# Patient Record
Sex: Female | Born: 1950 | Race: Black or African American | Hispanic: No | Smoking: Never smoker
Health system: Southern US, Community
[De-identification: ages and names within clinical notes are randomized; demographics above are authoritative.]

## PROBLEM LIST (undated history)

## (undated) DIAGNOSIS — R42 Dizziness and giddiness: Secondary | ICD-10-CM

## (undated) DIAGNOSIS — K589 Irritable bowel syndrome without diarrhea: Secondary | ICD-10-CM

## (undated) DIAGNOSIS — E079 Disorder of thyroid, unspecified: Secondary | ICD-10-CM

## (undated) DIAGNOSIS — M5136 Other intervertebral disc degeneration, lumbar region: Secondary | ICD-10-CM

## (undated) DIAGNOSIS — C801 Malignant (primary) neoplasm, unspecified: Secondary | ICD-10-CM

## (undated) DIAGNOSIS — F419 Anxiety disorder, unspecified: Secondary | ICD-10-CM

## (undated) HISTORY — PX: BREAST LUMPECTOMY: SHX2

## (undated) HISTORY — PX: ABDOMINAL HYSTERECTOMY: SHX81

---

## 2016-11-12 ENCOUNTER — Emergency Department (HOSPITAL_BASED_OUTPATIENT_CLINIC_OR_DEPARTMENT_OTHER)
Admission: EM | Admit: 2016-11-12 | Discharge: 2016-11-13 | Disposition: A | Discharge status: Home / Self Care | Payer: Medicare HMO | Attending: Emergency Medicine | Admitting: Emergency Medicine

## 2016-11-12 ENCOUNTER — Encounter (HOSPITAL_BASED_OUTPATIENT_CLINIC_OR_DEPARTMENT_OTHER): Payer: Self-pay | Admitting: Emergency Medicine

## 2016-11-12 DIAGNOSIS — R197 Diarrhea, unspecified: Secondary | ICD-10-CM

## 2016-11-12 DIAGNOSIS — R42 Dizziness and giddiness: Secondary | ICD-10-CM | POA: Diagnosis present

## 2016-11-12 DIAGNOSIS — Z79899 Other long term (current) drug therapy: Secondary | ICD-10-CM | POA: Insufficient documentation

## 2016-11-12 DIAGNOSIS — Z7982 Long term (current) use of aspirin: Secondary | ICD-10-CM | POA: Diagnosis not present

## 2016-11-12 HISTORY — DX: Disorder of thyroid, unspecified: E07.9

## 2016-11-12 HISTORY — DX: Irritable bowel syndrome without diarrhea: K58.9

## 2016-11-12 HISTORY — DX: Malignant (primary) neoplasm, unspecified: C80.1

## 2016-11-12 NOTE — ED Triage Notes (Signed)
PT presents to ED with complaints of dizziness intermittent for the past year but worse today. PT had been taking meds without relief.

## 2016-11-13 DIAGNOSIS — R42 Dizziness and giddiness: Secondary | ICD-10-CM | POA: Diagnosis not present

## 2016-11-13 MED ORDER — DIAZEPAM 2 MG PO TABS: 2.0000 mg | ORAL_TABLET | Freq: Three times a day (TID) | ORAL | 0 refills | Status: DC | PRN

## 2016-11-13 MED ORDER — DIAZEPAM 5 MG/ML IJ SOLN
2.5000 mg | Freq: Once | INTRAMUSCULAR | Status: AC
  Administered 2016-11-13: 2.5 mg via INTRAVENOUS
  Filled 2016-11-13: qty 2

## 2016-11-13 NOTE — ED Provider Notes (Addendum)
[facility] DEPT [facility] Provider Note: Georgena Spurling, MD, FACEP  CSN: [id number] MRN: [id number] ARRIVAL: 11/12/16 at [place]: [facility]  Shannon Farrell is a 66 y.o. female with a long-standing history of vertigo. She describes the vertigo as the room spinning. It tends to happen sporadically but is somewhat changed with movements of the head. She had her meclizine dose increased to 12.5 milligrams 3 times a day about a month ago. Despite this she continues to have episodes of vertigo and yesterday her vertigo became "really bad". There is no associated nausea or vomiting. She is having some milder vertigo at the present time. She had been doing home exercises for realignment of crystals in her inner ears but has not been doing these exercises for several months.  She also states that for the past several weeks her stools have been loose, which she describes as leg normal diarrhea. She denies abdominal pain or cramping. She denies blood or mucus in the stools. She has not taken anything for the diarrhea.   Past Medical History:  Diagnosis Date  . Cancer ([place])   . Irritable bowel syndrome   . Thyroid disease     Past Surgical History:  Procedure Laterality Date  . BREAST LUMPECTOMY      No family history on file.  Social History  Substance Use Topics  . Smoking status: Not on file  . Smokeless tobacco: Not on file  . Alcohol use Not on file    Prior to Admission medications   Medication Sig Start Date End Date Taking? Authorizing Provider  aspirin EC 81 MG tablet Take 81 mg by mouth daily.   Yes Historical Provider, MD  calcium acetate (PHOSLO) 667 MG capsule Take by mouth 3 (three) times daily with meals.   Yes Historical Provider, MD  cetirizine (ZYRTEC) 10 MG tablet Take 10 mg by mouth daily.   Yes Historical Provider, MD  cyclobenzaprine (FLEXERIL) 10 MG tablet Take by mouth 3 (three) times daily as needed for  muscle spasms.   Yes Historical Provider, MD  diclofenac (VOLTAREN) 50 MG EC tablet Take 50 mg by mouth 2 (two) times daily.   Yes Historical Provider, MD  dicyclomine (BENTYL) 10 MG capsule Take 10 mg by mouth 4 (four) times daily -  before meals and at bedtime.   Yes Historical Provider, MD  folic acid (FOLVITE) 1 MG tablet Take 1 mg by mouth daily.   Yes Historical Provider, MD  levothyroxine (SYNTHROID, LEVOTHROID) 75 MCG tablet Take 75 mcg by mouth daily before breakfast.   Yes Historical Provider, MD  meclizine (ANTIVERT) 12.5 MG tablet Take 12.5 mg by mouth 3 (three) times daily as needed for dizziness.   Yes Historical Provider, MD  Multiple Vitamin (MULTIVITAMIN) capsule Take 1 capsule by mouth daily.   Yes Historical Provider, MD  omeprazole (PRILOSEC) 40 MG capsule Take 40 mg by mouth daily.   Yes Historical Provider, MD    Allergies Codeine; Tramadol; and Vicodin [hydrocodone-acetaminophen]   REVIEW OF SYSTEMS  Negative except as noted here or in the History of Present Illness.   PHYSICAL EXAMINATION  Initial Vital Signs Blood pressure 115/74, pulse 76, temperature 98.4 F (36.9 C), temperature source Oral, resp. rate 12, height 5' 4.5" (1.638 m), weight 151 lb (68.5 kg), SpO2 97 %.  Examination General: Well-developed, well-nourished female in no acute distress; appearance consistent with age of record HENT: normocephalic; atraumatic Eyes: pupils  equal, round and reactive to light; extraocular muscles intact; no nystagmus Neck: supple Heart: regular rate and rhythm Lungs: clear to auscultation bilaterally Abdomen: soft; nondistended; nontender; no masses or hepatosplenomegaly; bowel sounds present Extremities: No deformity; full range of motion; pulses normal Neurologic: Awake, alert and oriented; motor function intact in all extremities and symmetric; no facial droop Skin: Warm and dry Psychiatric: Normal mood and affect   RESULTS  Summary of this visit's results,  reviewed by myself:   EKG Interpretation  Date/Time:    Ventricular Rate:    PR Interval:    QRS Duration:   QT Interval:    QTC Calculation:   R Axis:     Text Interpretation:        Laboratory Studies: No results found for this or any previous visit (from the past 24 hour(s)). Imaging Studies: No results found.  ED COURSE  Nursing notes and initial vitals signs, including pulse oximetry, reviewed.  Vitals:   11/13/16 0034 11/13/16 0045 11/13/16 0100 11/13/16 0200  BP: 108/75  115/74 120/75  Pulse: 77 78 76 75  Resp: 18 15 12  (!) 23  Temp:      TempSrc:      SpO2: 95% 97% 97% 97%  Weight:      Height:       2:48 AM Vertigo improved after 2.5 milligrams of Valium IV. Patient was advised to restart her vertigo therapy exercises. She was advised to continue taking meclizine but we will provide a prescription for Valium to take in its place for more severe episodes. She was advised that Valium can cause drowsiness.  PROCEDURES    ED DIAGNOSES     ICD-9-CM ICD-10-CM   1. Vertigo 780.4 R42   2. Diarrhea in adult patient 787.91 R19.7        Shanon Rosser, MD 11/13/16 [place], MD 11/13/16 [phone]

## 2016-11-13 NOTE — ED Notes (Signed)
ED Provider at bedside. 

## 2016-11-13 NOTE — ED Notes (Signed)
Pt given d/c instructions as per chart. Rx x 1 with precautions. Verbalizes understanding. No questions. 

## 2017-02-18 ENCOUNTER — Encounter (HOSPITAL_BASED_OUTPATIENT_CLINIC_OR_DEPARTMENT_OTHER): Payer: Self-pay | Admitting: Emergency Medicine

## 2017-02-18 ENCOUNTER — Emergency Department (HOSPITAL_BASED_OUTPATIENT_CLINIC_OR_DEPARTMENT_OTHER)
Admission: EM | Admit: 2017-02-18 | Discharge: 2017-02-19 | Disposition: A | Discharge status: Home / Self Care | Payer: Medicare HMO | Attending: Physician Assistant | Admitting: Physician Assistant

## 2017-02-18 DIAGNOSIS — M62838 Other muscle spasm: Secondary | ICD-10-CM | POA: Insufficient documentation

## 2017-02-18 DIAGNOSIS — Z79899 Other long term (current) drug therapy: Secondary | ICD-10-CM | POA: Insufficient documentation

## 2017-02-18 DIAGNOSIS — Z859 Personal history of malignant neoplasm, unspecified: Secondary | ICD-10-CM | POA: Diagnosis not present

## 2017-02-18 DIAGNOSIS — Z7982 Long term (current) use of aspirin: Secondary | ICD-10-CM | POA: Insufficient documentation

## 2017-02-18 DIAGNOSIS — M542 Cervicalgia: Secondary | ICD-10-CM | POA: Diagnosis present

## 2017-02-18 MED ORDER — CYCLOBENZAPRINE HCL 10 MG PO TABS: 10.0000 mg | ORAL_TABLET | Freq: Two times a day (BID) | ORAL | 0 refills | Status: DC | PRN

## 2017-02-18 MED ORDER — IBUPROFEN 800 MG PO TABS: 800.0000 mg | ORAL_TABLET | Freq: Three times a day (TID) | ORAL | 0 refills | Status: DC

## 2017-02-18 MED ORDER — IBUPROFEN 800 MG PO TABS
800.0000 mg | ORAL_TABLET | Freq: Once | ORAL | Status: AC
  Administered 2017-02-18: 800 mg via ORAL
  Filled 2017-02-18: qty 1

## 2017-02-18 NOTE — ED Provider Notes (Signed)
[facility] DEPT [facility] Provider Note   CSN: [id number] Arrival date & time: 02/18/17  2245     History   Chief Complaint Chief Complaint  Patient presents with  . Neck Pain    HPI Shannon Farrell is a 66 y.o. female.  HPI Patient is here with pain in the left side of her head and left trapezius. Patient reports it feels tight. She took a Flexeril yesterday and immediately completely better. However she didn't have any functional today. She took some Tylenol which made a little bit better.   Patient was worried that she was having a stroke. She's had no neurologic symptoms.  Past Medical History:  Diagnosis Date  . Cancer ([place])   . Irritable bowel syndrome   . Thyroid disease     There are no active problems to display for this patient.   Past Surgical History:  Procedure Laterality Date  . BREAST LUMPECTOMY      OB History    No data available       Home Medications    Prior to Admission medications   Medication Sig Start Date End Date Taking? Authorizing Provider  aspirin EC 81 MG tablet Take 81 mg by mouth daily.    [provider]  calcium acetate (PHOSLO) 667 MG capsule Take by mouth 3 (three) times daily with meals.    [provider]  cetirizine (ZYRTEC) 10 MG tablet Take 10 mg by mouth daily.    [provider]  cyclobenzaprine (FLEXERIL) 10 MG tablet Take by mouth 3 (three) times daily as needed for muscle spasms.    [provider]  cyclobenzaprine (FLEXERIL) 10 MG tablet Take 1 tablet (10 mg total) by mouth 2 (two) times daily as needed for muscle spasms. 02/18/17   Koni Kannan Lyn, MD  diazepam (VALIUM) 2 MG tablet Take 1 tablet (2 mg total) by mouth every 8 (eight) hours as needed (for vertigo; may cause drowsiness). 11/13/16   Molpus, John, MD  diclofenac (VOLTAREN) 50 MG EC tablet Take 50 mg by mouth 2 (two) times daily.    [provider]  dicyclomine (BENTYL) 10 MG capsule Take 10 mg by mouth 4  (four) times daily -  before meals and at bedtime.    [provider]  folic acid (FOLVITE) 1 MG tablet Take 1 mg by mouth daily.    [provider]  ibuprofen (ADVIL,MOTRIN) 800 MG tablet Take 1 tablet (800 mg total) by mouth 3 (three) times daily. 02/18/17   Lorance Pickeral Lyn, MD  levothyroxine (SYNTHROID, LEVOTHROID) 75 MCG tablet Take 75 mcg by mouth daily before breakfast.    [provider]  meclizine (ANTIVERT) 12.5 MG tablet Take 12.5 mg by mouth 3 (three) times daily as needed for dizziness.    [provider]  Multiple Vitamin (MULTIVITAMIN) capsule Take 1 capsule by mouth daily.    [provider]  omeprazole (PRILOSEC) 40 MG capsule Take 40 mg by mouth daily.    [provider]    Family History No family history on file.  Social History Social History  Substance Use Topics  . Smoking status: Not on file  . Smokeless tobacco: Not on file  . Alcohol use Not on file     Allergies   Codeine; Tramadol; and Vicodin [hydrocodone-acetaminophen]   Review of Systems Review of Systems  Constitutional: Negative for activity change.  Respiratory: Negative for shortness of breath.   Cardiovascular: Negative for chest pain.  Gastrointestinal: Negative  for abdominal pain.     Physical Exam Updated Vital Signs BP 134/82 (BP Location: Left Arm)   Pulse 83   Temp 98.7 F (37.1 C) (Oral)   Resp 19   SpO2 98%   Physical Exam  Constitutional: She is oriented to person, place, and time. She appears well-developed and well-nourished.  HENT:  Head: Normocephalic and atraumatic.  Eyes: Right eye exhibits no discharge.  Neck:  No menigismus. Tenderness to L trapezius.   Cardiovascular: Normal rate, regular rhythm and normal heart sounds.   No murmur heard. Pulmonary/Chest: Effort normal and breath sounds normal. She has no wheezes. She has no rales.  Neurological: She is oriented to person, place, and time. No cranial  nerve deficit.  Equal strength and sensation bialteral UE. AMbualtion without difficulty.   Skin: Skin is warm and dry. She is not diaphoretic.  Psychiatric: She has a normal mood and affect.  Nursing note and vitals reviewed.    ED Treatments / Results  Labs (all labs ordered are listed, but only abnormal results are displayed) Labs Reviewed - No data to display  EKG  EKG Interpretation None       Radiology No results found.  Procedures Procedures (including critical care time)  Medications Ordered in ED Medications  ibuprofen (ADVIL,MOTRIN) tablet 800 mg (not administered)     Initial Impression / Assessment and Plan / ED Course  I have reviewed the triage vital signs and the nursing notes.  Pertinent labs & imaging results that were available during my care of the patient were reviewed by me and considered in my medical decision making (see chart for details).    Patient's here with muscle tightness in her left trapezius. No neurologic deficits. Did not suspect any  pathology. Patient was better Flexeril. We'll treat with flexeril again.   Final Clinical Impressions(s) / ED Diagnoses   Final diagnoses:  Trapezius muscle spasm    New Prescriptions New Prescriptions   CYCLOBENZAPRINE (FLEXERIL) 10 MG TABLET    Take 1 tablet (10 mg total) by mouth 2 (two) times daily as needed for muscle spasms.   IBUPROFEN (ADVIL,MOTRIN) 800 MG TABLET    Take 1 tablet (800 mg total) by mouth 3 (three) times daily.     Macarthur Critchley, MD 02/18/17 [phone]

## 2017-02-18 NOTE — ED Triage Notes (Signed)
PT presents with c/o neck pain since yesterday. No none injury

## 2017-02-18 NOTE — Discharge Instructions (Signed)
We think this is a muscle spasm. Please return with any change in symptoms.

## 2017-07-28 ENCOUNTER — Encounter (HOSPITAL_BASED_OUTPATIENT_CLINIC_OR_DEPARTMENT_OTHER): Payer: Self-pay | Admitting: Emergency Medicine

## 2017-07-28 ENCOUNTER — Other Ambulatory Visit: Payer: Self-pay

## 2017-07-28 ENCOUNTER — Emergency Department (HOSPITAL_BASED_OUTPATIENT_CLINIC_OR_DEPARTMENT_OTHER)
Admission: EM | Admit: 2017-07-28 | Discharge: 2017-07-28 | Disposition: A | Discharge status: Home / Self Care | Payer: Medicare HMO | Attending: Emergency Medicine | Admitting: Emergency Medicine

## 2017-07-28 DIAGNOSIS — S0501XA Injury of conjunctiva and corneal abrasion without foreign body, right eye, initial encounter: Secondary | ICD-10-CM

## 2017-07-28 DIAGNOSIS — X58XXXA Exposure to other specified factors, initial encounter: Secondary | ICD-10-CM | POA: Diagnosis not present

## 2017-07-28 DIAGNOSIS — Z79899 Other long term (current) drug therapy: Secondary | ICD-10-CM | POA: Insufficient documentation

## 2017-07-28 DIAGNOSIS — Z7982 Long term (current) use of aspirin: Secondary | ICD-10-CM | POA: Insufficient documentation

## 2017-07-28 DIAGNOSIS — E079 Disorder of thyroid, unspecified: Secondary | ICD-10-CM | POA: Insufficient documentation

## 2017-07-28 DIAGNOSIS — Y93E8 Activity, other personal hygiene: Secondary | ICD-10-CM | POA: Diagnosis not present

## 2017-07-28 DIAGNOSIS — Y92019 Unspecified place in single-family (private) house as the place of occurrence of the external cause: Secondary | ICD-10-CM | POA: Insufficient documentation

## 2017-07-28 DIAGNOSIS — Y998 Other external cause status: Secondary | ICD-10-CM | POA: Insufficient documentation

## 2017-07-28 DIAGNOSIS — Z853 Personal history of malignant neoplasm of breast: Secondary | ICD-10-CM | POA: Insufficient documentation

## 2017-07-28 DIAGNOSIS — S0591XA Unspecified injury of right eye and orbit, initial encounter: Secondary | ICD-10-CM | POA: Diagnosis present

## 2017-07-28 DIAGNOSIS — H53141 Visual discomfort, right eye: Secondary | ICD-10-CM | POA: Diagnosis not present

## 2017-07-28 MED ORDER — FLUORESCEIN SODIUM 1 MG OP STRP
1.0000 | ORAL_STRIP | Freq: Once | OPHTHALMIC | Status: AC
  Administered 2017-07-28: 1 via OPHTHALMIC
  Filled 2017-07-28: qty 1

## 2017-07-28 MED ORDER — TETRACAINE HCL 0.5 % OP SOLN
2.0000 [drp] | Freq: Once | OPHTHALMIC | Status: AC
  Administered 2017-07-28: 2 [drp] via OPHTHALMIC
  Filled 2017-07-28: qty 4

## 2017-07-28 MED ORDER — ERYTHROMYCIN 5 MG/GM OP OINT
TOPICAL_OINTMENT | Freq: Once | OPHTHALMIC | Status: AC
  Administered 2017-07-28: 1 via OPHTHALMIC
  Filled 2017-07-28: qty 3.5

## 2017-07-28 NOTE — ED Triage Notes (Signed)
Patient states that she was trying to put her eye liner on her right eye and she may have pocked her right eye with the eyeline pencil or she has a piece in her eye. The patient tried to wash her eye out and it has not helped

## 2017-07-28 NOTE — Discharge Instructions (Signed)
Medications: Erythromycin ointment  Treatment: Apply half-inch ribbon to your right lower eyelid 4 times daily for 5 days.  You can take ibuprofen as prescribed as well as Tylenol alternating for pain.  Follow-up: Please follow-up with the ophthalmologist, Dr. Katy Fitch, by calling his office first thing on Monday morning for recheck and further evaluation.  Please return to the emergency department in the meantime if you develop any new or worsening symptoms.

## 2017-07-28 NOTE — ED Notes (Signed)
ED Provider at bedside. 

## 2017-07-28 NOTE — ED Notes (Signed)
Pt verbalizes understanding of d/c instructions and denies any further needs at this time. 

## 2017-07-29 NOTE — ED Provider Notes (Signed)
[facility] EMERGENCY DEPARTMENT Provider Note   CSN: [id number] Arrival date & time: 07/28/17  1947     History   Chief Complaint Chief Complaint  Patient presents with  . Eye Problem    HPI Shannon Farrell is a 67 y.o. female with history of breast cancer, irritable bowel syndrome, thyroid disease who presents with right eye pain.  Patient reports she was putting on eye pencil on and feels a piece broke off in her eye.  Since then, she has been having significant irritation in the right eye.  She has had associated photophobia.  She denies any vision changes other than reporting it hurts to keep her eye open.  It hurts her eye to blink. She washed her eye out at home with an over-the-counter eye wash and the sink at home.  She reports she still feels like there is a foreign body in her eye.  HPI  Past Medical History:  Diagnosis Date  . Cancer ([facility])    breast  . Irritable bowel syndrome   . Thyroid disease     There are no active problems to display for this patient.   Past Surgical History:  Procedure Laterality Date  . BREAST LUMPECTOMY      OB History    No data available       Home Medications    Prior to Admission medications   Medication Sig Start Date End Date Taking? Authorizing Provider  aspirin EC 81 MG tablet Take 81 mg by mouth daily.    [provider]  calcium acetate (PHOSLO) 667 MG capsule Take by mouth 3 (three) times daily with meals.    [provider]  cetirizine (ZYRTEC) 10 MG tablet Take 10 mg by mouth daily.    [provider]  cyclobenzaprine (FLEXERIL) 10 MG tablet Take by mouth 3 (three) times daily as needed for muscle spasms.    [provider]  cyclobenzaprine (FLEXERIL) 10 MG tablet Take 1 tablet (10 mg total) by mouth 2 (two) times daily as needed for muscle spasms. 02/18/17   Mackuen, Courteney Lyn, MD  diazepam (VALIUM) 2 MG tablet Take 1 tablet (2 mg total) by mouth every 8 (eight) hours  as needed (for vertigo; may cause drowsiness). 11/13/16   Molpus, John, MD  diclofenac (VOLTAREN) 50 MG EC tablet Take 50 mg by mouth 2 (two) times daily.    [provider]  dicyclomine (BENTYL) 10 MG capsule Take 10 mg by mouth 4 (four) times daily -  before meals and at bedtime.    [provider]  folic acid (FOLVITE) 1 MG tablet Take 1 mg by mouth daily.    [provider]  ibuprofen (ADVIL,MOTRIN) 800 MG tablet Take 1 tablet (800 mg total) by mouth 3 (three) times daily. 02/18/17   Mackuen, Courteney Lyn, MD  levothyroxine (SYNTHROID, LEVOTHROID) 75 MCG tablet Take 75 mcg by mouth daily before breakfast.    [provider]  meclizine (ANTIVERT) 12.5 MG tablet Take 12.5 mg by mouth 3 (three) times daily as needed for dizziness.    [provider]  Multiple Vitamin (MULTIVITAMIN) capsule Take 1 capsule by mouth daily.    [provider]  omeprazole (PRILOSEC) 40 MG capsule Take 40 mg by mouth daily.    [provider]    Family History History reviewed. No pertinent family history.  Social History Social History   Tobacco Use  . Smoking status: Never Smoker  . Smokeless tobacco: Never  Used  Substance Use Topics  . Alcohol use: Not on file  . Drug use: Not on file     Allergies   Codeine; Tramadol; and Vicodin [hydrocodone-acetaminophen]   Review of Systems Review of Systems  Constitutional: Negative for fever.  Eyes: Positive for photophobia, pain, discharge (watering) and redness. Negative for itching and visual disturbance.     Physical Exam Updated Vital Signs BP 122/76 (BP Location: Left Arm)   Pulse 72   Temp 98.7 F (37.1 C) (Oral)   Resp 18   Ht 5' 4.5" (1.638 m)   Wt 65.8 kg (145 lb)   SpO2 100%   BMI 24.50 kg/m   Physical Exam  Constitutional: She appears well-developed and well-nourished. No distress.  HENT:  Head: Normocephalic and atraumatic.  Mouth/Throat: Oropharynx is clear and moist.  No oropharyngeal exudate.  Eyes: EOM and lids are normal. Pupils are equal, round, and reactive to light. Lids are everted and swept, no foreign bodies found. Right eye exhibits discharge (watery). No foreign body present in the right eye. Left eye exhibits no discharge. Right conjunctiva is injected. Left conjunctiva is not injected. No scleral icterus.    No consensual photophobia No tenderness to the lid or surrounding the eye  Neck: Normal range of motion.  Cardiovascular: Normal rate, regular rhythm, normal heart sounds and intact distal pulses. Exam reveals no gallop and no friction rub.  No murmur heard. Pulmonary/Chest: Effort normal and breath sounds normal. No stridor. No respiratory distress. She has no wheezes. She has no rales.  Musculoskeletal: She exhibits no edema.  Neurological: She is alert. Coordination normal.  Skin: Skin is warm and dry. No rash noted. She is not diaphoretic. No pallor.  Psychiatric: She has a normal mood and affect.  Nursing note and vitals reviewed.    ED Treatments / Results  Labs (all labs ordered are listed, but only abnormal results are displayed) Labs Reviewed - No data to display  EKG  EKG Interpretation None       Radiology No results found.  Procedures Procedures (including critical care time)  Medications Ordered in ED Medications  tetracaine (PONTOCAINE) 0.5 % ophthalmic solution 2 drop (2 drops Right Eye Given by Other 07/28/17 2325)  fluorescein ophthalmic strip 1 strip (1 strip Right Eye Given by Other 07/28/17 2325)  erythromycin ophthalmic ointment (1 application Right Eye Given 07/28/17 2325)     Initial Impression / Assessment and Plan / ED Course  I have reviewed the triage vital signs and the nursing notes.  Pertinent labs & imaging results that were available during my care of the patient were reviewed by me and considered in my medical decision making (see chart for details).     Pt with corneal abrasion on  exam. No evidence of FB.  Exam not concerning for orbital cellulitis, hyphema. No concern for uveitis. Patient will be discharged home with erythromycin ointment.   Patient understands to follow up with ophthalmology in 2-3 days; return precautions discussed. Patient appears safe for discharge. Patient also evaluated by Dr. Leonette Monarch who agrees with plan.  Patient vitals stable throughout ED course and discharged in satisfactory condition   Final Clinical Impressions(s) / ED Diagnoses   Final diagnoses:  Abrasion of right cornea, initial encounter    ED Discharge Orders    None       Frederica Kuster, PA-C 07/29/17 1146    Fatima Blank, MD 07/30/17 [phone]

## 2018-01-01 ENCOUNTER — Emergency Department (HOSPITAL_BASED_OUTPATIENT_CLINIC_OR_DEPARTMENT_OTHER)
Admission: EM | Admit: 2018-01-01 | Discharge: 2018-01-01 | Disposition: A | Discharge status: Home / Self Care | Payer: Medicare HMO | Attending: Emergency Medicine | Admitting: Emergency Medicine

## 2018-01-01 ENCOUNTER — Encounter (HOSPITAL_BASED_OUTPATIENT_CLINIC_OR_DEPARTMENT_OTHER): Payer: Self-pay

## 2018-01-01 ENCOUNTER — Other Ambulatory Visit: Payer: Self-pay

## 2018-01-01 ENCOUNTER — Emergency Department (HOSPITAL_BASED_OUTPATIENT_CLINIC_OR_DEPARTMENT_OTHER): Payer: Medicare HMO

## 2018-01-01 DIAGNOSIS — Z79899 Other long term (current) drug therapy: Secondary | ICD-10-CM | POA: Diagnosis not present

## 2018-01-01 DIAGNOSIS — Z7982 Long term (current) use of aspirin: Secondary | ICD-10-CM | POA: Diagnosis not present

## 2018-01-01 DIAGNOSIS — K219 Gastro-esophageal reflux disease without esophagitis: Secondary | ICD-10-CM | POA: Insufficient documentation

## 2018-01-01 DIAGNOSIS — R079 Chest pain, unspecified: Secondary | ICD-10-CM | POA: Diagnosis not present

## 2018-01-01 DIAGNOSIS — R110 Nausea: Secondary | ICD-10-CM | POA: Insufficient documentation

## 2018-01-01 DIAGNOSIS — R059 Cough, unspecified: Secondary | ICD-10-CM

## 2018-01-01 DIAGNOSIS — R05 Cough: Secondary | ICD-10-CM | POA: Insufficient documentation

## 2018-01-01 DIAGNOSIS — R0789 Other chest pain: Secondary | ICD-10-CM

## 2018-01-01 HISTORY — DX: Anxiety disorder, unspecified: F41.9

## 2018-01-01 LAB — BASIC METABOLIC PANEL
Anion gap: 12 (ref 5–15)
BUN: 12 mg/dL (ref 6–20)
CALCIUM: 8.9 mg/dL (ref 8.9–10.3)
CO2: 27 mmol/L (ref 22–32)
Chloride: 100 mmol/L — ABNORMAL LOW (ref 101–111)
Creatinine, Ser: 0.69 mg/dL (ref 0.44–1.00)
GFR calc Af Amer: >60 mL/min (ref >60)
GFR calc non Af Amer: >60 mL/min (ref >60)
GLUCOSE: 87 mg/dL (ref 65–99)
Potassium: 2.8 mmol/L — ABNORMAL LOW (ref 3.5–5.1)
Sodium: 139 mmol/L (ref 135–145)

## 2018-01-01 LAB — CBC
HEMATOCRIT: 35.2 % — AB (ref 36.0–46.0)
Hemoglobin: 12.1 g/dL (ref 12.0–15.0)
MCH: 27.8 pg (ref 26.0–34.0)
MCHC: 34.4 g/dL (ref 30.0–36.0)
MCV: 80.9 fL (ref 78.0–100.0)
Platelets: 252 10*3/uL (ref 150–400)
RBC: 4.35 MIL/uL (ref 3.87–5.11)
RDW: 14.4 % (ref 11.5–15.5)
WBC: 9.1 10*3/uL (ref 4.0–10.5)

## 2018-01-01 LAB — TROPONIN I

## 2018-01-01 MED ORDER — GI COCKTAIL ~~LOC~~
30.0000 mL | Freq: Once | ORAL | Status: AC
  Administered 2018-01-01: 30 mL via ORAL
  Filled 2018-01-01: qty 30

## 2018-01-01 MED ORDER — POTASSIUM CHLORIDE CRYS ER 20 MEQ PO TBCR
40.0000 meq | EXTENDED_RELEASE_TABLET | Freq: Once | ORAL | Status: AC
Start: 2018-01-01 — End: 2018-01-01
  Administered 2018-01-01: 40 meq via ORAL
  Filled 2018-01-01: qty 2

## 2018-01-01 MED ORDER — ALBUTEROL SULFATE HFA 108 (90 BASE) MCG/ACT IN AERS
2.0000 | INHALATION_SPRAY | Freq: Once | RESPIRATORY_TRACT | Status: AC
Start: 2018-01-01 — End: 2018-01-01
  Administered 2018-01-01: 2 via RESPIRATORY_TRACT
  Filled 2018-01-01: qty 6.7

## 2018-01-01 NOTE — ED Notes (Signed)
Attempted IV x 1 without success.  

## 2018-01-01 NOTE — Discharge Instructions (Signed)
Your work-up today did not show evidence of a bacterial infection or pneumonia.  We suspect you may have had a viral infection leading to your coughing.  As you felt better with albuterol, please use it at home and use 2 puffs every 4-6 hours.  You may also use the Maalox since that seemed to help each night for the reflux discomfort refill.  Please follow-up with your primary doctor in several days.  If any symptoms change or worsen, please return to the nearest emergency department

## 2018-01-01 NOTE — ED Notes (Signed)
ED Provider at bedside. 

## 2018-01-01 NOTE — ED Notes (Signed)
Attempted IV in left hand unsuccessful.  

## 2018-01-01 NOTE — ED Provider Notes (Signed)
[facility] Provider Note   CSN: [id number] Arrival date & time: 01/01/18  1952     History   Chief Complaint Chief Complaint  Patient presents with  . Chest Pain    HPI Shannon Farrell is a 67 y.o. female.  The history is provided by the patient and medical records.  Cough  This is a new problem. The current episode started more than 1 week ago. The problem occurs constantly. The problem has not changed since onset.The cough is non-productive. There has been no fever. Associated symptoms include chills. Pertinent negatives include no chest pain, no sweats, no headaches, no rhinorrhea, no shortness of breath and no wheezing. She has tried cough syrup for the symptoms. The treatment provided no relief. Her past medical history does not include pneumonia, COPD, emphysema or asthma.    Past Medical History:  Diagnosis Date  . Anxiety   . Cancer (HCC)    breast  . Irritable bowel syndrome   . Thyroid disease     There are no active problems to display for this patient.   Past Surgical History:  Procedure Laterality Date  . BREAST LUMPECTOMY       OB History   None      Home Medications    Prior to Admission medications   Medication Sig Start Date End Date Taking? Authorizing Provider  aspirin EC 81 MG tablet Take 81 mg by mouth daily.    [provider]  calcium acetate (PHOSLO) 667 MG capsule Take by mouth 3 (three) times daily with meals.    [provider]  cetirizine (ZYRTEC) 10 MG tablet Take 10 mg by mouth daily.    [provider]  cyclobenzaprine (FLEXERIL) 10 MG tablet Take by mouth 3 (three) times daily as needed for muscle spasms.    [provider]  cyclobenzaprine (FLEXERIL) 10 MG tablet Take 1 tablet (10 mg total) by mouth 2 (two) times daily as needed for muscle spasms. 02/18/17   Mackuen, Courteney Lyn, MD  diazepam (VALIUM) 2 MG tablet Take 1 tablet (2 mg total) by mouth every 8  (eight) hours as needed (for vertigo; may cause drowsiness). 11/13/16   Molpus, John, MD  diclofenac (VOLTAREN) 50 MG EC tablet Take 50 mg by mouth 2 (two) times daily.    [provider]  dicyclomine (BENTYL) 10 MG capsule Take 10 mg by mouth 4 (four) times daily -  before meals and at bedtime.    [provider]  folic acid (FOLVITE) 1 MG tablet Take 1 mg by mouth daily.    [provider]  ibuprofen (ADVIL,MOTRIN) 800 MG tablet Take 1 tablet (800 mg total) by mouth 3 (three) times daily. 02/18/17   Mackuen, Courteney Lyn, MD  levothyroxine (SYNTHROID, LEVOTHROID) 75 MCG tablet Take 75 mcg by mouth daily before breakfast.    [provider]  meclizine (ANTIVERT) 12.5 MG tablet Take 12.5 mg by mouth 3 (three) times daily as needed for dizziness.    [provider]  Multiple Vitamin (MULTIVITAMIN) capsule Take 1 capsule by mouth daily.    [provider]  omeprazole (PRILOSEC) 40 MG capsule Take 40 mg by mouth daily.    [provider]    Family History No family history on file.  Social History Social History   Tobacco Use  . Smoking status: Never Smoker  . Smokeless tobacco: Never Used  Substance Use Topics  . Alcohol use: Never    Frequency:  Never  . Drug use: Never     Allergies   Codeine; Tramadol; and Vicodin [hydrocodone-acetaminophen]   Review of Systems Review of Systems  Constitutional: Positive for chills. Negative for diaphoresis, fatigue and fever.  HENT: Negative for congestion and rhinorrhea.   Respiratory: Positive for cough and chest tightness. Negative for shortness of breath, wheezing and stridor.   Cardiovascular: Negative for chest pain, palpitations and leg swelling.  Gastrointestinal: Positive for nausea. Negative for constipation, diarrhea and vomiting.  Genitourinary: Negative for flank pain.  Musculoskeletal: Negative for back pain, neck pain and neck stiffness.  Neurological: Negative for  weakness, light-headedness, numbness and headaches.  Psychiatric/Behavioral: Negative for agitation.  All other systems reviewed and are negative.    Physical Exam Updated Vital Signs BP 127/72 (BP Location: Left Arm)   Pulse 84   Temp 98.3 F (36.8 C) (Oral)   Resp 20   Wt 65.2 kg (143 lb 12.8 oz)   SpO2 100%   BMI 24.30 kg/m   Physical Exam  Constitutional: She appears well-developed and well-nourished. No distress.  HENT:  Head: Normocephalic and atraumatic.  Nose: Nose normal.  Mouth/Throat: Oropharynx is clear and moist. No oropharyngeal exudate.  Eyes: Pupils are equal, round, and reactive to light. Conjunctivae and EOM are normal.  Neck: Normal range of motion. Neck supple.  Cardiovascular: Normal rate, regular rhythm and intact distal pulses.  No murmur heard. Pulmonary/Chest: Effort normal and breath sounds normal. No respiratory distress. She has no wheezes. She has no rales. She exhibits no tenderness.  Abdominal: Soft. She exhibits no distension. There is no tenderness.  Musculoskeletal: She exhibits no edema or tenderness.  Lymphadenopathy:    She has no cervical adenopathy.  Neurological: She is alert. No sensory deficit. She exhibits normal muscle tone.  Skin: Skin is warm and dry. Capillary refill takes less than 2 seconds. No rash noted. She is not diaphoretic. No erythema.  Psychiatric: She has a normal mood and affect.  Nursing note and vitals reviewed.    ED Treatments / Results  Labs (all labs ordered are listed, but only abnormal results are displayed) Labs Reviewed  BASIC METABOLIC PANEL - Abnormal; Notable for the following components:      Result Value   Potassium 2.8 (*)    Chloride 100 (*)    All other components within normal limits  CBC - Abnormal; Notable for the following components:   HCT 35.2 (*)    All other components within normal limits  TROPONIN I    EKG EKG Interpretation  Date/Time:  Monday January 01 2018 19:58:02  EDT Ventricular Rate:  79 PR Interval:  162 QRS Duration: 82 QT Interval:  396 QTC Calculation: 454 R Axis:   43 Text Interpretation:  Normal sinus rhythm with sinus arrhythmia Nonspecific ST and T wave abnormality Abnormal ECG When compared to prior, no significant changes.  No STEMI Confirmed by Antony Blackbird [phone]) on 01/01/2018 11:27:11 PM   Radiology Dg Chest 2 View  Result Date: 01/01/2018 CLINICAL DATA:  Chest pain, cough for 1 week. EXAM: CHEST - 2 VIEW COMPARISON:  None. FINDINGS: Cardiomediastinal silhouette is normal. No pleural effusions or focal consolidations. Mild bronchitic changes with bandlike density LEFT lung base. Trachea projects midline and there is no pneumothorax. Soft tissue planes and included osseous structures are non-suspicious. Mild scoliosis. IMPRESSION: Bronchitic changes with LEFT lung base atelectasis/scarring. Electronically Signed   By: Elon Alas M.D.   On: 01/01/2018 20:32    Procedures Procedures (including  critical care time)  Medications Ordered in ED Medications  albuterol (PROVENTIL HFA;VENTOLIN HFA) 108 (90 Base) MCG/ACT inhaler 2 puff (2 puffs Inhalation Given 01/01/18 2150)  gi cocktail (Maalox,Lidocaine,Donnatal) (30 mLs Oral Given 01/01/18 2147)  potassium chloride SA (K-DUR,KLOR-CON) CR tablet 40 mEq (40 mEq Oral Given 01/01/18 2335)     Initial Impression / Assessment and Plan / ED Course  I have reviewed the triage vital signs and the nursing notes.  Pertinent labs & imaging results that were available during my care of the patient were reviewed by me and considered in my medical decision making (see chart for details).     Gredmarie Delange is a 67 y.o. female with a past medical history significant for prior breast cancer, anxiety, reflux, and thyroid disease who presents with rhinorrhea, congestion, cough, chills, nausea, and reflux burning.  Patient reports that for the last week she has had the symptoms.  She said that she  tried over-the-counter medications without success.  She reports she still having coughing fits and has reflux type burning.  She says that she has had some chills but no fevers at home.  She reports some nausea but no vomiting.  She denies any urinary symptoms or other GI symptoms.  She reports that there have been sick contacts at home.  She denies any production of her cough.  She describes no history of asthma or COPD.  She tried taking Tessalon at home without success of the cough.  On exam, lungs were clear.  Patient's chest was nontender.  Abdomen was nontender.  Patient had no significant edema in her legs or upper extremities.  Symmetric pulses in upper extremities.  Patient overall appears well.  Given patient's report of the cough that has been worsening for the last week, patient had x-ray to look for pneumonia.  X-ray showed no evidence of pneumonia.  Patient also had screening laboratory testing that was reassuring aside from hypokalemia.  Patient had potassium given orally to help.  Patient was given a GI cocktail with her reflux discomfort and it was significantly reduced her symptoms.  She was also given 2 puffs of albuterol with improvement in her breathing.  Suspect mild reactive airway symptoms in the setting of likely upper respiratory infection.  Patient was observed for period of time with improvement in symptoms.  Given her improved breathing and symptoms patient was felt stable for discharge home.  Patient will be given the albuterol inhaler to use as needed until she sees her PCP.  She agreed with plan of care and will stay hydrated.  Patient had no other questions or concerns and was discharged in good condition with understanding of return precautions.    Final Clinical Impressions(s) / ED Diagnoses   Final diagnoses:  Cough  Chest tightness  Nausea  Gastroesophageal reflux disease, esophagitis presence not specified    ED Discharge Orders    None     Clinical  Impression: 1. Cough   2. Chest tightness   3. Nausea   4. Gastroesophageal reflux disease, esophagitis presence not specified     Disposition: Discharge  Condition: Good  I have discussed the results, Dx and Tx plan with the pt(& family if present). He/she/they expressed understanding and agree(s) with the plan. Discharge instructions discussed at great length. Strict return precautions discussed and pt &/or family have verbalized understanding of the instructions. No further questions at time of discharge.    Discharge Medication List as of 01/01/2018 11:34 PM  Follow Up: Franne Forts, MD     [facility] [address] [id number] [place] [phone]       Jeslie Lowe, Gwenyth Allegra, MD 01/01/18 [phone]

## 2018-01-01 NOTE — ED Notes (Signed)
Unable to obtain pt signature due to pad not working

## 2018-01-01 NOTE — ED Triage Notes (Addendum)
C/o CP x 1 week-also c/o cough-was seen by PCP last week-rx benzonatate-taking tums and mucinex with no relief-states she feels no better-pt NAD-steady gait

## 2018-05-27 ENCOUNTER — Other Ambulatory Visit: Payer: Self-pay

## 2018-05-27 ENCOUNTER — Encounter (HOSPITAL_BASED_OUTPATIENT_CLINIC_OR_DEPARTMENT_OTHER): Payer: Self-pay | Admitting: Emergency Medicine

## 2018-05-27 ENCOUNTER — Emergency Department (HOSPITAL_BASED_OUTPATIENT_CLINIC_OR_DEPARTMENT_OTHER)
Admission: EM | Admit: 2018-05-27 | Discharge: 2018-05-27 | Disposition: A | Discharge status: Home / Self Care | Payer: Medicare HMO | Attending: Emergency Medicine | Admitting: Emergency Medicine

## 2018-05-27 DIAGNOSIS — Z7982 Long term (current) use of aspirin: Secondary | ICD-10-CM | POA: Diagnosis not present

## 2018-05-27 DIAGNOSIS — Z79899 Other long term (current) drug therapy: Secondary | ICD-10-CM | POA: Diagnosis not present

## 2018-05-27 DIAGNOSIS — M6281 Muscle weakness (generalized): Secondary | ICD-10-CM | POA: Insufficient documentation

## 2018-05-27 DIAGNOSIS — R197 Diarrhea, unspecified: Secondary | ICD-10-CM | POA: Insufficient documentation

## 2018-05-27 DIAGNOSIS — E079 Disorder of thyroid, unspecified: Secondary | ICD-10-CM | POA: Diagnosis not present

## 2018-05-27 DIAGNOSIS — E876 Hypokalemia: Secondary | ICD-10-CM | POA: Insufficient documentation

## 2018-05-27 DIAGNOSIS — R531 Weakness: Secondary | ICD-10-CM

## 2018-05-27 LAB — URINALYSIS, ROUTINE W REFLEX MICROSCOPIC
BILIRUBIN URINE: NEGATIVE
Glucose, UA: NEGATIVE mg/dL
Ketones, ur: NEGATIVE mg/dL
Leukocytes, UA: NEGATIVE
Nitrite: NEGATIVE
PH: 6.5 (ref 5.0–8.0)
Protein, ur: NEGATIVE mg/dL
SPECIFIC GRAVITY, URINE: 1.01 (ref 1.005–1.030)

## 2018-05-27 LAB — COMPREHENSIVE METABOLIC PANEL
ALK PHOS: 65 U/L (ref 38–126)
ALT: 22 U/L (ref 0–44)
ANION GAP: 12 (ref 5–15)
AST: 22 U/L (ref 15–41)
Albumin: 4.1 g/dL (ref 3.5–5.0)
BILIRUBIN TOTAL: 0.2 mg/dL — AB (ref 0.3–1.2)
BUN: 12 mg/dL (ref 8–23)
CHLORIDE: 104 mmol/L (ref 98–111)
CO2: 26 mmol/L (ref 22–32)
Calcium: 9.2 mg/dL (ref 8.9–10.3)
Creatinine, Ser: 0.67 mg/dL (ref 0.44–1.00)
GFR calc Af Amer: >60 mL/min (ref >60)
Glucose, Bld: 88 mg/dL (ref 70–99)
POTASSIUM: 2.6 mmol/L — AB (ref 3.5–5.1)
Sodium: 142 mmol/L (ref 135–145)
TOTAL PROTEIN: 7.2 g/dL (ref 6.5–8.1)

## 2018-05-27 LAB — CBC
HEMATOCRIT: 39.4 % (ref 36.0–46.0)
HEMOGLOBIN: 12.7 g/dL (ref 12.0–15.0)
MCH: 27 pg (ref 26.0–34.0)
MCHC: 32.2 g/dL (ref 30.0–36.0)
MCV: 83.7 fL (ref 80.0–100.0)
Platelets: 261 10*3/uL (ref 150–400)
RBC: 4.71 MIL/uL (ref 3.87–5.11)
RDW: 14.3 % (ref 11.5–15.5)
WBC: 8 10*3/uL (ref 4.0–10.5)
nRBC: 0 % (ref 0.0–0.2)

## 2018-05-27 LAB — URINALYSIS, MICROSCOPIC (REFLEX)

## 2018-05-27 LAB — LIPASE, BLOOD: LIPASE: 29 U/L (ref 11–51)

## 2018-05-27 MED ORDER — POTASSIUM CHLORIDE 10 MEQ/100ML IV SOLN
10.0000 meq | Freq: Once | INTRAVENOUS | Status: AC
  Administered 2018-05-27: 10 meq via INTRAVENOUS
  Filled 2018-05-27: qty 100

## 2018-05-27 MED ORDER — POTASSIUM CHLORIDE ER 10 MEQ PO TBCR: 20.0000 meq | EXTENDED_RELEASE_TABLET | Freq: Every day | ORAL | 0 refills | Status: DC

## 2018-05-27 MED ORDER — POTASSIUM CHLORIDE CRYS ER 20 MEQ PO TBCR
30.0000 meq | EXTENDED_RELEASE_TABLET | Freq: Once | ORAL | Status: AC
Start: 2018-05-27 — End: 2018-05-27
  Administered 2018-05-27: 30 meq via ORAL

## 2018-05-27 MED ORDER — SODIUM CHLORIDE 0.9 % IV BOLUS
1000.0000 mL | Freq: Once | INTRAVENOUS | Status: AC
  Administered 2018-05-27: 1000 mL via INTRAVENOUS

## 2018-05-27 MED ORDER — POTASSIUM CHLORIDE CRYS ER 20 MEQ PO TBCR
EXTENDED_RELEASE_TABLET | ORAL | Status: AC
  Filled 2018-05-27: qty 2

## 2018-05-27 MED ORDER — SODIUM CHLORIDE 0.9 % IV SOLN
Freq: Once | INTRAVENOUS | Status: AC
  Administered 2018-05-27: 14:00:00 via INTRAVENOUS

## 2018-05-27 NOTE — ED Notes (Signed)
Unable to give stool sample.

## 2018-05-27 NOTE — ED Notes (Signed)
Date and time results received: 05/27/18 1348   Test: K Critical Value: 2.6  Name of Provider Notified: Nanavati  Orders Received? Or Actions Taken?: no orders given

## 2018-05-27 NOTE — ED Triage Notes (Signed)
Pt states she was started on an abx on Monday for a sinus infection and has had diarrhea since then, multiple times per day.

## 2018-05-27 NOTE — ED Notes (Signed)
Ambulated pt. Pt states she still feels a little lightheaded, but stable. Pt observed to have a steady, even gait.

## 2018-05-27 NOTE — Discharge Instructions (Signed)
Take potassium pills as directed.  Make sure you are staying hydrated drink plenty of fluids.  Return to emergency department for any worsening weakness, chest pain, difficulty breathing, blood in stools, fevers or any other worsening or concerning symptoms.

## 2018-05-27 NOTE — ED Notes (Signed)
Urine prompted- pt states unable to urinate at this time.

## 2018-05-27 NOTE — ED Provider Notes (Signed)
[facility] EM[facility] DEPARTMENT Provider Note   CSN: [id number] Arrival date & time: 05/27/18  1138     History   Chief Complaint Chief Complaint  Patient presents with  . Diarrhea    HPI Shannon Farrell is a 67 y.o. female past medical history of IBS, thyroid disease who presents for evaluation of generalized weakness, diarrhea that is been ongoing for last week.  Patient reports that a proxy 1 week ago, she was started on Augmentin for treatment of sinus infection.  Patient reports that after she started the medication, she started developing watery, loose stools.  She reports several episodes of stools a day.  No blood in stools.  She states she has been able to eat and drink appropriately denies any vomiting.  Patient states he is very generalized weakness and lightheaded.  No focal weakness.  Patient states that she does have a history of IBS but this feels different.  Patient states that she has not had any recent travel or other antibiotic use.  She denies any fevers, chest pain, difficulty breathing, abdominal pain, urinary complaints.  The history is provided by the patient.    Past Medical History:  Diagnosis Date  . Anxiety   . Cancer (HCC)    breast  . Irritable bowel syndrome   . Thyroid disease     There are no active problems to display for this patient.   Past Surgical History:  Procedure Laterality Date  . BREAST LUMPECTOMY       OB History   None      Home Medications    Prior to Admission medications   Medication Sig Start Date End Date Taking? Authorizing Provider  aspirin EC 81 MG tablet Take 81 mg by mouth daily.    [provider]  calcium acetate (PHOSLO) 667 MG capsule Take by mouth 3 (three) times daily with meals.    [provider]  cetirizine (ZYRTEC) 10 MG tablet Take 10 mg by mouth daily.    [provider]  cyclobenzaprine (FLEXERIL) 10 MG tablet Take by mouth 3 (three) times daily as needed for  muscle spasms.    [provider]  cyclobenzaprine (FLEXERIL) 10 MG tablet Take 1 tablet (10 mg total) by mouth 2 (two) times daily as needed for muscle spasms. 02/18/17   Mackuen, Courteney Lyn, MD  diazepam (VALIUM) 2 MG tablet Take 1 tablet (2 mg total) by mouth every 8 (eight) hours as needed (for vertigo; may cause drowsiness). 11/13/16   Molpus, John, MD  diclofenac (VOLTAREN) 50 MG EC tablet Take 50 mg by mouth 2 (two) times daily.    [provider]  dicyclomine (BENTYL) 10 MG capsule Take 10 mg by mouth 4 (four) times daily -  before meals and at bedtime.    [provider]  folic acid (FOLVITE) 1 MG tablet Take 1 mg by mouth daily.    [provider]  ibuprofen (ADVIL,MOTRIN) 800 MG tablet Take 1 tablet (800 mg total) by mouth 3 (three) times daily. 02/18/17   Mackuen, Courteney Lyn, MD  levothyroxine (SYNTHROID, LEVOTHROID) 75 MCG tablet Take 75 mcg by mouth daily before breakfast.    [provider]  meclizine (ANTIVERT) 12.5 MG tablet Take 12.5 mg by mouth 3 (three) times daily as needed for dizziness.    [provider]  Multiple Vitamin (MULTIVITAMIN) capsule Take 1 capsule by mouth daily.    [provider]  omeprazole (PRILOSEC) 40 MG capsule Take 40  mg by mouth daily.    [provider]  potassium chloride (K-DUR) 10 MEQ tablet Take 2 tablets (20 mEq total) by mouth daily for 3 days. 05/27/18 05/30/18  Volanda Napoleon, PA-C    Family History No family history on file.  Social History Social History   Tobacco Use  . Smoking status: Never Smoker  . Smokeless tobacco: Never Used  Substance Use Topics  . Alcohol use: Never    Frequency: Never  . Drug use: Never     Allergies   Codeine; Tramadol; and Vicodin [hydrocodone-acetaminophen]   Review of Systems Review of Systems  Constitutional: Negative for fever.  Respiratory: Negative for cough and shortness of breath.   Cardiovascular: Negative for  chest pain.  Gastrointestinal: Positive for diarrhea. Negative for abdominal pain, blood in stool, nausea and vomiting.  Genitourinary: Negative for dysuria and hematuria.  Neurological: Positive for weakness (generalized) and light-headedness. Negative for numbness and headaches.  All other systems reviewed and are negative.    Physical Exam Updated Vital Signs BP 124/80   Pulse 70   Temp 98.4 F (36.9 C) (Oral)   Resp 20   Ht 5\' 4"  (1.626 m)   Wt 64 kg   SpO2 96%   BMI 24.20 kg/m   Physical Exam  Constitutional: She is oriented to person, place, and time. She appears well-developed and well-nourished.  Sitting comfortably on examination table  HENT:  Head: Normocephalic and atraumatic.  Mouth/Throat: Uvula is midline and oropharynx is clear and moist. Mucous membranes are dry.  Eyes: Pupils are equal, round, and reactive to light. Conjunctivae, EOM and lids are normal.  Neck: Full passive range of motion without pain.  Cardiovascular: Normal rate, regular rhythm, normal heart sounds and normal pulses. Exam reveals no gallop and no friction rub.  No murmur heard. Pulses:      Radial pulses are 2+ on the right side, and 2+ on the left side.  Pulmonary/Chest: Effort normal and breath sounds normal.  Abdominal: Soft. Normal appearance. There is no tenderness. There is no rigidity and no guarding.  Abdomen is soft, non-distended, non-tender. No rigidity, No guarding. No peritoneal signs.  Musculoskeletal: Normal range of motion.  Neurological: She is alert and oriented to person, place, and time.  Cranial nerves III-XII intact Follows commands, Moves all extremities  5/5 strength to BUE and BLE  Sensation intact throughout all major nerve distributions Normal coordination No slurred speech. No facial droop.   Skin: Skin is warm and dry. Capillary refill takes less than 2 seconds.  Psychiatric: She has a normal mood and affect. Her speech is normal.  Nursing note and vitals  reviewed.    ED Treatments / Results  Labs (all labs ordered are listed, but only abnormal results are displayed) Labs Reviewed  URINALYSIS, ROUTINE W REFLEX MICROSCOPIC - Abnormal; Notable for the following components:      Result Value   Color, Urine STRAW (*)    Hgb urine dipstick TRACE (*)    All other components within normal limits  COMPREHENSIVE METABOLIC PANEL - Abnormal; Notable for the following components:   Potassium 2.6 (*)    Total Bilirubin 0.2 (*)    All other components within normal limits  URINALYSIS, MICROSCOPIC (REFLEX) - Abnormal; Notable for the following components:   Bacteria, UA RARE (*)    All other components within normal limits  C DIFFICILE QUICK SCREEN W PCR REFLEX  CBC  LIPASE, BLOOD    EKG None  Radiology  No results found.  Procedures Procedures (including critical care time)  Medications Ordered in ED Medications  sodium chloride 0.9 % bolus 1,000 mL ( Intravenous Stopped 05/27/18 1417)  potassium chloride 10 mEq in 100 mL IVPB ( Intravenous Stopped 05/27/18 1510)  0.9 %  sodium chloride infusion ( Intravenous Stopped 05/27/18 1510)  potassium chloride SA (K-DUR,KLOR-CON) CR tablet 30 mEq (30 mEq Oral Given 05/27/18 1618)  sodium chloride 0.9 % bolus 1,000 mL ( Intravenous Stopped 05/27/18 1731)     Initial Impression / Assessment and Plan / ED Course  I have reviewed the triage vital signs and the nursing notes.  Pertinent labs & imaging results that were available during my care of the patient were reviewed by me and considered in my medical decision making (see chart for details).     67 year old female who presents for evaluation of diarrhea that is been ongoing for last several days.  Reports it started after she started Augmentin for sinus infection.  She also feels generalized weakness.  No focal weakness.  No chest pain, difficulty breathing, fevers.  No blood noted in stools.  No recent travel. Patient is afebrile, non-toxic  appearing, sitting comfortably on examination table. Vital signs reviewed and stable.  Exam is benign.  No focal weakness noted.  No neuro deficits noted.  Consider electively imbalance versus dehydration versus diarrhea secondary to antibiotic use.  Plan check basic labs, give fluids.  Patient/physical exam is not concerning for diverticulitis, CVA.  CBC shows no significant leukocytosis or anemia.  Lipase unremarkable.  CMP shows potassium of 2.6.  Otherwise unremarkable.  UA shows trace hemoglobin, otherwise unremarkable.  Will give oral and IV potassium replacement.  Reevaluation.  Patient states she feels still feels little lightheaded when she walks after fluids.  We will plan to give additional liter and reassess.  Patient has had no bowel movement since being here in the ED.  Reevaluation.  Patient was able to ambulate in the department without any difficulty.  Patient is able to tolerate p.o. in the department.  She has had no episodes of diarrhea since being here in the ED.  Patient states she feels ready to go home.  Repeat neuro exam is unremarkable. Patient had ample opportunity for questions and discussion. All patient's questions were answered with full understanding. Strict return precautions discussed. Patient expresses understanding and agreement to plan.   Final Clinical Impressions(s) / ED Diagnoses   Final diagnoses:  Diarrhea, unspecified type  Hypokalemia  Generalized weakness    ED Discharge Orders         Ordered    potassium chloride (K-DUR) 10 MEQ tablet  Daily     05/27/18 1827           Volanda Napoleon, PA-C 05/27/18 Rea, [place], MD 05/28/18 [phone]

## 2018-06-09 ENCOUNTER — Emergency Department (HOSPITAL_BASED_OUTPATIENT_CLINIC_OR_DEPARTMENT_OTHER)
Admission: EM | Admit: 2018-06-09 | Discharge: 2018-06-09 | Disposition: A | Discharge status: Home / Self Care | Payer: Medicare HMO | Attending: Emergency Medicine | Admitting: Emergency Medicine

## 2018-06-09 ENCOUNTER — Other Ambulatory Visit: Payer: Self-pay

## 2018-06-09 ENCOUNTER — Encounter (HOSPITAL_BASED_OUTPATIENT_CLINIC_OR_DEPARTMENT_OTHER): Payer: Self-pay | Admitting: Emergency Medicine

## 2018-06-09 DIAGNOSIS — R21 Rash and other nonspecific skin eruption: Secondary | ICD-10-CM | POA: Diagnosis present

## 2018-06-09 DIAGNOSIS — Z7982 Long term (current) use of aspirin: Secondary | ICD-10-CM | POA: Insufficient documentation

## 2018-06-09 DIAGNOSIS — Z79899 Other long term (current) drug therapy: Secondary | ICD-10-CM | POA: Diagnosis not present

## 2018-06-09 DIAGNOSIS — Z853 Personal history of malignant neoplasm of breast: Secondary | ICD-10-CM | POA: Diagnosis not present

## 2018-06-09 DIAGNOSIS — T7840XA Allergy, unspecified, initial encounter: Secondary | ICD-10-CM | POA: Insufficient documentation

## 2018-06-09 NOTE — ED Provider Notes (Signed)
[facility] EMERGENCY DEPARTMENT Provider Note   CSN: [id number] Arrival date & time: 06/09/18  1325     History   Chief Complaint Chief Complaint  Patient presents with  . Allergic Reaction    HPI Shannon Farrell is a 67 y.o. female.  67 y.o female with a PMH of Anxiety, CA, IBS presents to the ED with a chief complaint of allergic reaction.Patient reports she was eating breakfast this morning and noted a slight rash to her chin region.She states she looked in the mirror and saw slight dryness and small bumps around her chin. She reports having been treated with antifungal for a yeast infection recently her PCP but she reports she has completed the oral therapy and has been applying the cream to her vaginal region only.  Not tried any medication for relieving symptoms.  Denies any shortness of breath, chest pain, difficulty breathing or other complaints.     Past Medical History:  Diagnosis Date  . Anxiety   . Cancer (HCC)    breast  . Irritable bowel syndrome   . Thyroid disease     There are no active problems to display for this patient.   Past Surgical History:  Procedure Laterality Date  . BREAST LUMPECTOMY       OB History   None      Home Medications    Prior to Admission medications   Medication Sig Start Date End Date Taking? Authorizing Provider  cetirizine (ZYRTEC) 10 MG tablet Take 10 mg by mouth daily.   Yes [provider]  Cholecalciferol (VITAMIN D) 50 MCG (2000 UT) tablet Take 4,000 Units by mouth daily.   Yes [provider]  cyclobenzaprine (FLEXERIL) 10 MG tablet Take by mouth 3 (three) times daily as needed for muscle spasms.   Yes [provider]  cyclobenzaprine (FLEXERIL) 10 MG tablet Take 1 tablet (10 mg total) by mouth 2 (two) times daily as needed for muscle spasms. 02/18/17  Yes Mackuen, Courteney Lyn, MD  diazepam (VALIUM) 2 MG tablet Take 1 tablet (2 mg total) by mouth every 8 (eight) hours as  needed (for vertigo; may cause drowsiness). 11/13/16  Yes Molpus, John, MD  diclofenac (VOLTAREN) 50 MG EC tablet Take 50 mg by mouth 2 (two) times daily.   Yes [provider]  dicyclomine (BENTYL) 10 MG capsule Take 10 mg by mouth 4 (four) times daily -  before meals and at bedtime.   Yes [provider]  ibuprofen (ADVIL,MOTRIN) 800 MG tablet Take 1 tablet (800 mg total) by mouth 3 (three) times daily. 02/18/17  Yes Mackuen, Courteney Lyn, MD  levothyroxine (SYNTHROID, LEVOTHROID) 75 MCG tablet Take 75 mcg by mouth daily before breakfast.   Yes [provider]  meclizine (ANTIVERT) 12.5 MG tablet Take 12.5 mg by mouth 3 (three) times daily as needed for dizziness.   Yes [provider]  aspirin EC 81 MG tablet Take 81 mg by mouth daily.    [provider]  calcium acetate (PHOSLO) 667 MG capsule Take by mouth 3 (three) times daily with meals.    [provider]  folic acid (FOLVITE) 1 MG tablet Take 1 mg by mouth daily.    [provider]  Multiple Vitamin (MULTIVITAMIN) capsule Take 1 capsule by mouth daily.    [provider]  omeprazole (PRILOSEC) 40 MG capsule Take 40 mg by mouth daily.    [provider]  potassium chloride (K-DUR) 10 MEQ tablet Take  2 tablets (20 mEq total) by mouth daily for 3 days. 05/27/18 05/30/18  Volanda Napoleon, PA-C    Family History No family history on file.  Social History Social History   Tobacco Use  . Smoking status: Never Smoker  . Smokeless tobacco: Never Used  Substance Use Topics  . Alcohol use: Never    Frequency: Never  . Drug use: Never     Allergies   Codeine; Tramadol; and Vicodin [hydrocodone-acetaminophen]   Review of Systems Review of Systems  Constitutional: Negative for fever.  Skin: Positive for rash.     Physical Exam Updated Vital Signs BP 136/89 (BP Location: Left Arm)   Pulse 97   Temp 98.2 F (36.8 C) (Oral)   Resp 20   Ht 5' 4.5"  (1.638 m)   Wt 64.9 kg   SpO2 97%   BMI 24.17 kg/m   Physical Exam  Constitutional: She is oriented to person, place, and time. She appears well-developed and well-nourished. No distress.  HENT:  Head: Normocephalic and atraumatic.  Mouth/Throat: Oropharynx is clear and moist. No oropharyngeal exudate.  Eyes: Pupils are equal, round, and reactive to light.  Neck: Normal range of motion.  Cardiovascular: Regular rhythm and normal heart sounds.  Pulmonary/Chest: Effort normal and breath sounds normal. No respiratory distress.  Abdominal: Soft. Bowel sounds are normal. She exhibits no distension. There is no tenderness.  Musculoskeletal: She exhibits no tenderness or deformity.       Right lower leg: She exhibits no edema.       Left lower leg: She exhibits no edema.  Neurological: She is alert and oriented to person, place, and time.  Skin: Skin is warm and dry. Rash noted. Rash is urticarial.     Slight trace urticarial bumps around the chin region.  Psychiatric: She has a normal mood and affect.  Nursing note and vitals reviewed.    ED Treatments / Results  Labs (all labs ordered are listed, but only abnormal results are displayed) Labs Reviewed - No data to display  EKG None  Radiology No results found.  Procedures Procedures (including critical care time)  Medications Ordered in ED Medications - No data to display   Initial Impression / Assessment and Plan / ED Course  I have reviewed the triage vital signs and the nursing notes.  Pertinent labs & imaging results that were available during my care of the patient were reviewed by me and considered in my medical decision making (see chart for details).    She presents to the ED with allergic reaction to her chin which began this morning.  She reports she is recently been treated with antifungal to treat her yeast infection but now is currently only using the topical therapy.  Patient reports she has switched PCPs  as the previous PCPs were given her too many medications.  She has a new appointment with her primary care physician on Monday at 1030.  Have advised patient she may take Benadryl for the symptoms.  No airway compromise noted.  Patient is advised to this up to her primary care physician on Monday.  Vitals stable during ED visit, patient stable for discharge.  Final Clinical Impressions(s) / ED Diagnoses   Final diagnoses:  Allergic reaction, initial encounter    ED Discharge Orders    None       Janeece Fitting, PA-C 06/09/18 [place], Adam, DO 06/10/18 0001

## 2018-06-09 NOTE — ED Triage Notes (Signed)
Pt c/o facial itching and breaking out x about a week; has not started any new meds; not using new products

## 2018-06-09 NOTE — ED Notes (Signed)
Pt reports rash and itching to face for over 1 week. Has been seen by PCP and treated for vaginal yeast infection but states she is still having itching and discharge. Fine raised rash noted to chin

## 2018-06-09 NOTE — Discharge Instructions (Addendum)
You may take some benadryl to help with the itching. If you experience any shortness of breath, chest pain or worsening symptoms you ay return to the ED for reevaluation in symptoms.

## 2018-06-10 ENCOUNTER — Other Ambulatory Visit: Payer: Self-pay

## 2018-06-10 ENCOUNTER — Emergency Department (HOSPITAL_BASED_OUTPATIENT_CLINIC_OR_DEPARTMENT_OTHER)
Admission: EM | Admit: 2018-06-10 | Discharge: 2018-06-10 | Disposition: A | Discharge status: Home / Self Care | Payer: Medicare HMO | Attending: Emergency Medicine | Admitting: Emergency Medicine

## 2018-06-10 ENCOUNTER — Encounter (HOSPITAL_BASED_OUTPATIENT_CLINIC_OR_DEPARTMENT_OTHER): Payer: Self-pay | Admitting: *Deleted

## 2018-06-10 DIAGNOSIS — L988 Other specified disorders of the skin and subcutaneous tissue: Secondary | ICD-10-CM | POA: Diagnosis not present

## 2018-06-10 DIAGNOSIS — Z79899 Other long term (current) drug therapy: Secondary | ICD-10-CM | POA: Diagnosis not present

## 2018-06-10 DIAGNOSIS — Z853 Personal history of malignant neoplasm of breast: Secondary | ICD-10-CM | POA: Insufficient documentation

## 2018-06-10 DIAGNOSIS — Z7982 Long term (current) use of aspirin: Secondary | ICD-10-CM | POA: Diagnosis not present

## 2018-06-10 DIAGNOSIS — R21 Rash and other nonspecific skin eruption: Secondary | ICD-10-CM | POA: Insufficient documentation

## 2018-06-10 MED ORDER — HYDROCORTISONE 1 % EX CREA: TOPICAL_CREAM | CUTANEOUS | 0 refills | Status: DC

## 2018-06-10 NOTE — ED Triage Notes (Signed)
Pt reports rash to face for over a week. She was seen here yesterday for same. States she feels much worse today. "My face is on fire". Taking OTC meds without relief

## 2018-06-10 NOTE — ED Notes (Signed)
ED Provider at bedside. 

## 2018-06-10 NOTE — Discharge Instructions (Signed)
Start using CETAPHIL to clean face twice daily. After washing face, apply hydrating face lotion such as CETAPHIL, AVEENO, or EUCERIN. You may apply hydrocortisone to the few small areas of scaling or red skin. Avoid eyes and mouth. Only use for 2-3 days, up to twice daily.

## 2018-06-10 NOTE — ED Provider Notes (Signed)
[facility] EMERGENCY DEPARTMENT Provider Note   CSN: [id number] Arrival date & time: 06/10/18  2056     History   Chief Complaint Chief Complaint  Patient presents with  . Rash    HPI Shannon Farrell is a 67 y.o. female.  67 year old female with past medical history below who presents with facial rash.  She here to the ED yesterday for 1 week of rash and generalized burning to her face.  She states that she feels like her face is on fire.  She denies any recent changes to body products, lotions, detergents, or other products.  She was given Benadryl at time of discharge and has taken it twice today but feels like her symptoms are worse.  She denies any other areas of rash or burning on her body.  No other symptoms currently although she notes that she was sick earlier this month.  The history is provided by the patient.  Rash      Past Medical History:  Diagnosis Date  . Anxiety   . Cancer (HCC)    breast  . Irritable bowel syndrome   . Thyroid disease     There are no active problems to display for this patient.   Past Surgical History:  Procedure Laterality Date  . BREAST LUMPECTOMY       OB History   None      Home Medications    Prior to Admission medications   Medication Sig Start Date End Date Taking? Authorizing Provider  aspirin EC 81 MG tablet Take 81 mg by mouth daily.    [provider]  calcium acetate (PHOSLO) 667 MG capsule Take by mouth 3 (three) times daily with meals.    [provider]  cetirizine (ZYRTEC) 10 MG tablet Take 10 mg by mouth daily.    [provider]  Cholecalciferol (VITAMIN D) 50 MCG (2000 UT) tablet Take 4,000 Units by mouth daily.    [provider]  cyclobenzaprine (FLEXERIL) 10 MG tablet Take by mouth 3 (three) times daily as needed for muscle spasms.    [provider]  cyclobenzaprine (FLEXERIL) 10 MG tablet Take 1 tablet (10 mg total) by mouth 2 (two) times daily  as needed for muscle spasms. 02/18/17   Mackuen, Courteney Lyn, MD  diazepam (VALIUM) 2 MG tablet Take 1 tablet (2 mg total) by mouth every 8 (eight) hours as needed (for vertigo; may cause drowsiness). 11/13/16   Molpus, John, MD  diclofenac (VOLTAREN) 50 MG EC tablet Take 50 mg by mouth 2 (two) times daily.    [provider]  dicyclomine (BENTYL) 10 MG capsule Take 10 mg by mouth 4 (four) times daily -  before meals and at bedtime.    [provider]  folic acid (FOLVITE) 1 MG tablet Take 1 mg by mouth daily.    [provider]  hydrocortisone cream 1 % Apply to affected area 2 times daily as needed for itching/redness for up to 4 days 06/10/18   Aniayah Alaniz, Wenda Overland, MD  ibuprofen (ADVIL,MOTRIN) 800 MG tablet Take 1 tablet (800 mg total) by mouth 3 (three) times daily. 02/18/17   Mackuen, Courteney Lyn, MD  levothyroxine (SYNTHROID, LEVOTHROID) 75 MCG tablet Take 75 mcg by mouth daily before breakfast.    [provider]  meclizine (ANTIVERT) 12.5 MG tablet Take 12.5 mg by mouth 3 (three) times daily as needed for dizziness.    [provider]  Multiple Vitamin (MULTIVITAMIN) capsule Take 1  capsule by mouth daily.    [provider]  omeprazole (PRILOSEC) 40 MG capsule Take 40 mg by mouth daily.    [provider]  potassium chloride (K-DUR) 10 MEQ tablet Take 2 tablets (20 mEq total) by mouth daily for 3 days. 05/27/18 05/30/18  Volanda Napoleon, PA-C    Family History No family history on file.  Social History Social History   Tobacco Use  . Smoking status: Never Smoker  . Smokeless tobacco: Never Used  Substance Use Topics  . Alcohol use: Never    Frequency: Never  . Drug use: Never     Allergies   Codeine; Tramadol; and Vicodin [hydrocodone-acetaminophen]   Review of Systems Review of Systems  Constitutional: Negative for fever.  Skin: Positive for rash.  Neurological: Negative for numbness.  Hematological:  Negative for adenopathy.     Physical Exam Updated Vital Signs BP 126/79 (BP Location: Left Arm)   Pulse 78   Temp 98.5 F (36.9 C) (Oral)   Resp 18   Ht 5' 4.5" (1.638 m)   Wt 65 kg   SpO2 99%   BMI 24.22 kg/m   Physical Exam  Constitutional: She is oriented to person, place, and time. She appears well-developed and well-nourished. No distress.  HENT:  Head: Normocephalic and atraumatic.  Eyes: Conjunctivae are normal.  Neck: Neck supple.  Neurological: She is alert and oriented to person, place, and time.  Skin: Skin is warm and dry.  A few areas of skin dryness near corner of L mouth, L medial upper eyelid, no peeling; no erythema on face, no edema  Psychiatric: She has a normal mood and affect. Judgment normal.  Nursing note and vitals reviewed.    ED Treatments / Results  Labs (all labs ordered are listed, but only abnormal results are displayed) Labs Reviewed - No data to display  EKG None  Radiology No results found.  Procedures Procedures (including critical care time)  Medications Ordered in ED Medications - No data to display   Initial Impression / Assessment and Plan / ED Course  I have reviewed the triage vital signs and the nursing notes.      I don't actually appreciate a rash on exam of her face. She has a few areas of skin dryness. Have recommended conservative measures. Instructed to f/u PCP if no improvement.  Final Clinical Impressions(s) / ED Diagnoses   Final diagnoses:  Facial rash    ED Discharge Orders         Ordered    hydrocortisone cream 1 %     06/10/18 2254           Kayin Osment, Wenda Overland, MD 06/10/18 2354

## 2018-06-10 NOTE — ED Notes (Signed)
Pt understood dc material. NAD noted. Script given at dc  

## 2018-06-11 ENCOUNTER — Ambulatory Visit: Payer: Medicare HMO | Admitting: Family Medicine

## 2018-07-10 ENCOUNTER — Other Ambulatory Visit: Payer: Self-pay

## 2018-07-10 ENCOUNTER — Emergency Department (HOSPITAL_BASED_OUTPATIENT_CLINIC_OR_DEPARTMENT_OTHER): Payer: Medicare HMO

## 2018-07-10 ENCOUNTER — Emergency Department (HOSPITAL_BASED_OUTPATIENT_CLINIC_OR_DEPARTMENT_OTHER)
Admission: EM | Admit: 2018-07-10 | Discharge: 2018-07-11 | Disposition: A | Discharge status: Home / Self Care | Payer: Medicare HMO | Attending: Emergency Medicine | Admitting: Emergency Medicine

## 2018-07-10 ENCOUNTER — Encounter (HOSPITAL_BASED_OUTPATIENT_CLINIC_OR_DEPARTMENT_OTHER): Payer: Self-pay | Admitting: *Deleted

## 2018-07-10 DIAGNOSIS — Z853 Personal history of malignant neoplasm of breast: Secondary | ICD-10-CM | POA: Diagnosis not present

## 2018-07-10 DIAGNOSIS — Z7982 Long term (current) use of aspirin: Secondary | ICD-10-CM | POA: Diagnosis not present

## 2018-07-10 DIAGNOSIS — R197 Diarrhea, unspecified: Secondary | ICD-10-CM | POA: Insufficient documentation

## 2018-07-10 DIAGNOSIS — E876 Hypokalemia: Secondary | ICD-10-CM | POA: Diagnosis not present

## 2018-07-10 DIAGNOSIS — R109 Unspecified abdominal pain: Secondary | ICD-10-CM | POA: Diagnosis present

## 2018-07-10 DIAGNOSIS — K5790 Diverticulosis of intestine, part unspecified, without perforation or abscess without bleeding: Secondary | ICD-10-CM | POA: Insufficient documentation

## 2018-07-10 DIAGNOSIS — Z79899 Other long term (current) drug therapy: Secondary | ICD-10-CM | POA: Diagnosis not present

## 2018-07-10 LAB — URINALYSIS, ROUTINE W REFLEX MICROSCOPIC
BILIRUBIN URINE: NEGATIVE
GLUCOSE, UA: NEGATIVE mg/dL
Ketones, ur: 15 mg/dL — AB
Nitrite: NEGATIVE
PROTEIN: NEGATIVE mg/dL
SPECIFIC GRAVITY, URINE: 1.015 (ref 1.005–1.030)
pH: 6 (ref 5.0–8.0)

## 2018-07-10 LAB — LIPASE, BLOOD: Lipase: 26 U/L (ref 11–51)

## 2018-07-10 LAB — COMPREHENSIVE METABOLIC PANEL
ALBUMIN: 4.7 g/dL (ref 3.5–5.0)
ALK PHOS: 58 U/L (ref 38–126)
ALT: 20 U/L (ref 0–44)
AST: 24 U/L (ref 15–41)
Anion gap: 12 (ref 5–15)
BILIRUBIN TOTAL: 0.5 mg/dL (ref 0.3–1.2)
BUN: 11 mg/dL (ref 8–23)
CALCIUM: 8.3 mg/dL — AB (ref 8.9–10.3)
CO2: 24 mmol/L (ref 22–32)
Chloride: 103 mmol/L (ref 98–111)
Creatinine, Ser: 0.75 mg/dL (ref 0.44–1.00)
GFR calc Af Amer: >60 mL/min (ref >60)
GFR calc non Af Amer: >60 mL/min (ref >60)
GLUCOSE: 101 mg/dL — AB (ref 70–99)
Potassium: 2.7 mmol/L — CL (ref 3.5–5.1)
Sodium: 139 mmol/L (ref 135–145)
TOTAL PROTEIN: 7.9 g/dL (ref 6.5–8.1)

## 2018-07-10 LAB — WET PREP, GENITAL
Clue Cells Wet Prep HPF POC: NONE SEEN
SPERM: NONE SEEN
Trich, Wet Prep: NONE SEEN
Yeast Wet Prep HPF POC: NONE SEEN

## 2018-07-10 LAB — URINALYSIS, MICROSCOPIC (REFLEX)

## 2018-07-10 LAB — CBC
HCT: 39.3 % (ref 36.0–46.0)
Hemoglobin: 12.6 g/dL (ref 12.0–15.0)
MCH: 26.9 pg (ref 26.0–34.0)
MCHC: 32.1 g/dL (ref 30.0–36.0)
MCV: 83.8 fL (ref 80.0–100.0)
PLATELETS: 279 10*3/uL (ref 150–400)
RBC: 4.69 MIL/uL (ref 3.87–5.11)
RDW: 14.7 % (ref 11.5–15.5)
WBC: 10.3 10*3/uL (ref 4.0–10.5)
nRBC: 0 % (ref 0.0–0.2)

## 2018-07-10 MED ORDER — LOPERAMIDE HCL 2 MG PO CAPS: 2.0000 mg | ORAL_CAPSULE | Freq: Three times a day (TID) | ORAL | 0 refills | Status: DC | PRN

## 2018-07-10 MED ORDER — POTASSIUM CHLORIDE ER 20 MEQ PO TBCR: 20.0000 meq | EXTENDED_RELEASE_TABLET | Freq: Two times a day (BID) | ORAL | 0 refills | Status: DC

## 2018-07-10 MED ORDER — SODIUM CHLORIDE 0.9 % IV BOLUS
500.0000 mL | Freq: Once | INTRAVENOUS | Status: AC
  Administered 2018-07-10: 500 mL via INTRAVENOUS

## 2018-07-10 MED ORDER — POTASSIUM CHLORIDE 10 MEQ/100ML IV SOLN
10.0000 meq | INTRAVENOUS | Status: AC
  Administered 2018-07-10 (×2): 10 meq via INTRAVENOUS
  Filled 2018-07-10 (×2): qty 100

## 2018-07-10 MED ORDER — SODIUM CHLORIDE 0.9 % IV SOLN
INTRAVENOUS | Status: DC | PRN
  Administered 2018-07-10: 1000 mL via INTRAVENOUS

## 2018-07-10 MED ORDER — IOPAMIDOL (ISOVUE-300) INJECTION 61%
100.0000 mL | Freq: Once | INTRAVENOUS | Status: AC | PRN
  Administered 2018-07-10: 100 mL via INTRAVENOUS

## 2018-07-10 NOTE — ED Notes (Signed)
Date and time results received: 07/10/18 2008 (use smartphrase ".now" to insert current time)  Test: potassium Critical Value: 2.7  Name of Provider Notified: Sophia PA  Orders Received? Or Actions Taken?: awaiting further orders

## 2018-07-10 NOTE — ED Notes (Signed)
C/o weakness dizziness diarrhea after eating x 1.5 months,  Left lower abd pain  Has been seen for same  And has had ct

## 2018-07-10 NOTE — Discharge Instructions (Signed)
You taking home medications as prescribed. Take your IBS medicine as needed for cramping or diarrhea. Take Imodium as needed for diarrhea. Take potassium as prescribed for the next 5 days. Make sure you are staying well-hydrated with water. CT today showed diverticulosis without obvious infection or diverticula.  There is information about this in the paperwork. Use Tylenol and heating pads as needed for pain control. It is very important that you follow-up with your GI doctor for a colonoscopy. Return to the emergency room with any new, worsening, concerning symptoms.

## 2018-07-10 NOTE — ED Provider Notes (Signed)
[facility] Provider Note   CSN: [id number] Arrival date & time: 07/10/18  1910     History   Chief Complaint Chief Complaint  Patient presents with  . Abdominal Pain    HPI Shannon Farrell is a 67 y.o. female sent in for evaluation of abdominal pain and diarrhea.  Patient states she has had diarrhea for the past 4 weeks.  This began after getting penicillin for an ear infection.  Patient states she is having at least 3 stools a day, denies blood in her stool.  Today, she had severe worsening left lower quadrant abdominal pain.  She has associated nausea without vomiting.  She took Bentyl without improvement of her symptoms.  Patient states she has followed up with her GI doctor, and has a colonoscopy scheduled on the seventh.  She had a CT earlier this month, which showed concerning signs in her colon, and patient is very anxious about this.  She denies chest pain, shortness of breath, vomiting, upper abdominal pain, or urinary symptoms.  Patient states she has been having an abnormal vaginal discharge for the past month, but this has not been evaluated.  No history obtained from chart review.  Patient had a CT on December 3, which showed diverticulosis without obvious diverticulitis.  Additionally, patient's had a round-enhancing lesion in the right lower quadrant, favored to be a possible inflamed diverticula.  She has had negative C. difficile testing since the diarrhea began.  HPI  Past Medical History:  Diagnosis Date  . Anxiety   . Cancer (HCC)    breast  . Irritable bowel syndrome   . Thyroid disease     There are no active problems to display for this patient.   Past Surgical History:  Procedure Laterality Date  . BREAST LUMPECTOMY       OB History   No obstetric history on file.      Home Medications    Prior to Admission medications   Medication Sig Start Date End Date Taking? Authorizing Provider  aspirin EC 81 MG tablet  Take 81 mg by mouth daily.    [provider]  calcium acetate (PHOSLO) 667 MG capsule Take by mouth 3 (three) times daily with meals.    [provider]  cetirizine (ZYRTEC) 10 MG tablet Take 10 mg by mouth daily.    [provider]  Cholecalciferol (VITAMIN D) 50 MCG (2000 UT) tablet Take 4,000 Units by mouth daily.    [provider]  cyclobenzaprine (FLEXERIL) 10 MG tablet Take by mouth 3 (three) times daily as needed for muscle spasms.    [provider]  cyclobenzaprine (FLEXERIL) 10 MG tablet Take 1 tablet (10 mg total) by mouth 2 (two) times daily as needed for muscle spasms. 02/18/17   Mackuen, Courteney Lyn, MD  diazepam (VALIUM) 2 MG tablet Take 1 tablet (2 mg total) by mouth every 8 (eight) hours as needed (for vertigo; may cause drowsiness). 11/13/16   Molpus, John, MD  diclofenac (VOLTAREN) 50 MG EC tablet Take 50 mg by mouth 2 (two) times daily.    [provider]  dicyclomine (BENTYL) 10 MG capsule Take 10 mg by mouth 4 (four) times daily -  before meals and at bedtime.    [provider]  folic acid (FOLVITE) 1 MG tablet Take 1 mg by mouth daily.    [provider]  hydrocortisone cream 1 % Apply to affected area 2 times daily as needed for itching/redness  for up to 4 days 06/10/18   Little, Wenda Overland, MD  ibuprofen (ADVIL,MOTRIN) 800 MG tablet Take 1 tablet (800 mg total) by mouth 3 (three) times daily. 02/18/17   Mackuen, Courteney Lyn, MD  levothyroxine (SYNTHROID, LEVOTHROID) 75 MCG tablet Take 75 mcg by mouth daily before breakfast.    [provider]  loperamide (IMODIUM) 2 MG capsule Take 1 capsule (2 mg total) by mouth 3 (three) times daily as needed for diarrhea or loose stools. 07/10/18   Anylah Scheib, PA-C  meclizine (ANTIVERT) 12.5 MG tablet Take 12.5 mg by mouth 3 (three) times daily as needed for dizziness.    [provider]  Multiple Vitamin (MULTIVITAMIN) capsule Take 1  capsule by mouth daily.    [provider]  omeprazole (PRILOSEC) 40 MG capsule Take 40 mg by mouth daily.    [provider]  potassium chloride 20 MEQ TBCR Take 20 mEq by mouth 2 (two) times daily for 5 days. 07/10/18 07/15/18  Jerzy Roepke, PA-C    Family History No family history on file.  Social History Social History   Tobacco Use  . Smoking status: Never Smoker  . Smokeless tobacco: Never Used  Substance Use Topics  . Alcohol use: Never    Frequency: Never  . Drug use: Never     Allergies   Codeine; Tramadol; and Vicodin [hydrocodone-acetaminophen]   Review of Systems Review of Systems  Constitutional: Positive for appetite change.  Gastrointestinal: Positive for abdominal pain and diarrhea.  Genitourinary: Positive for vaginal discharge.  All other systems reviewed and are negative.    Physical Exam Updated Vital Signs BP 122/80 (BP Location: Left Arm)   Pulse 75   Temp 97.8 F (36.6 C) (Oral)   Resp 16   Ht 5' 4.5" (1.638 m)   Wt 65 kg   SpO2 99%   BMI 24.22 kg/m   Physical Exam Vitals signs and nursing note reviewed. Exam conducted with a chaperone present.  Constitutional:      General: She is not in acute distress.    Appearance: She is well-developed.     Comments: Appears nontoxic.  Patient is very anxious on exam  HENT:     Head: Normocephalic and atraumatic.     Comments: MM moist Eyes:     Conjunctiva/sclera: Conjunctivae normal.     Pupils: Pupils are equal, round, and reactive to light.  Neck:     Musculoskeletal: Normal range of motion and neck supple.  Cardiovascular:     Rate and Rhythm: Normal rate and regular rhythm.  Pulmonary:     Effort: Pulmonary effort is normal. No respiratory distress.     Breath sounds: Normal breath sounds. No wheezing.  Abdominal:     General: Bowel sounds are normal. There is no distension.     Palpations: Abdomen is soft.     Tenderness: There is abdominal tenderness in the  left lower quadrant.     Comments: Tenderness palpation of left lower quadrant.  Soft without rigidity, guarding, distention.  No tenderness palpation elsewhere in the abdomen.  Negative rebound.  Genitourinary:    Exam position: Supine.     Vagina: Vaginal discharge present.     Adnexa:        Right: No tenderness.         Left: No tenderness.       Comments: Uterus surgically absent.  Patient with light brown discharge that appears stool-like.  No obvious fistula noted on exam.  No adnexal tenderness. Musculoskeletal: Normal range of motion.  Skin:    General: Skin is warm and dry.     Capillary Refill: Capillary refill takes less than 2 seconds.  Neurological:     Mental Status: She is alert and oriented to person, place, and time.      ED Treatments / Results  Labs (all labs ordered are listed, but only abnormal results are displayed) Labs Reviewed  WET PREP, GENITAL - Abnormal; Notable for the following components:      Result Value   WBC, Wet Prep HPF POC MANY (*)    All other components within normal limits  COMPREHENSIVE METABOLIC PANEL - Abnormal; Notable for the following components:   Potassium 2.7 (*)    Glucose, Bld 101 (*)    Calcium 8.3 (*)    All other components within normal limits  URINALYSIS, ROUTINE W REFLEX MICROSCOPIC - Abnormal; Notable for the following components:   Hgb urine dipstick SMALL (*)    Ketones, ur 15 (*)    Leukocytes, UA SMALL (*)    All other components within normal limits  URINALYSIS, MICROSCOPIC (REFLEX) - Abnormal; Notable for the following components:   Bacteria, UA RARE (*)    All other components within normal limits  LIPASE, BLOOD  CBC  GC/CHLAMYDIA PROBE AMP ([address]) NOT AT [facility]    EKG EKG Interpretation  Date/Time:  Tuesday July 10 2018 19:27:43 EST Ventricular Rate:  70 PR Interval:    QRS Duration: 82 QT Interval:  424 QTC Calculation: 458 R Axis:   40 Text Interpretation:  Sinus rhythm Low voltage,  precordial leads No STEMI.  Confirmed by Nanda Quinton [phone]) on 07/10/2018 7:31:42 PM   Radiology Ct Abdomen Pelvis W Contrast  Result Date: 07/10/2018 CLINICAL DATA:  67 year old female with weakness, dizziness and diarrhea x1 0.5 months. Left lower quadrant pain. EXAM: CT ABDOMEN AND PELVIS WITH CONTRAST TECHNIQUE: Multidetector CT imaging of the abdomen and pelvis was performed using the standard protocol following bolus administration of intravenous contrast. CONTRAST:  185mL ISOVUE-300 IOPAMIDOL (ISOVUE-300) INJECTION 61% COMPARISON:  None available on PACs FINDINGS: Lower chest: Normal heart size without pericardial effusion. Scarring and atelectasis along the left major fissure. No effusion or pneumothorax. Hepatobiliary: Steatosis of the liver. The unremarkable gallbladder. No enhancing hepatic mass or biliary dilatation. Pancreas: No inflammation, enhancing mass or pathologic ductal dilatation. Spleen: Normal size spleen. Adrenals/Urinary Tract: Normal bilateral adrenal glands. Renal cortical scarring in the upper pole the left kidney. Fullness of both renal collecting systems with mild dilatation of the proximal to mid right ureter. Several calcifications are seen along the course of the right ureter, one at the pelvic brim and 3 or 4 near the right UVJ. These are felt to represent small phleboliths given lack of distal right-sided hydroureter. No enhancing renal mass. The urinary bladder is unremarkable for the degree of distention. Stomach/Bowel: The stomach is physiologically distended. The duodenal sweep and ligament of Treitz are unremarkable. No bowel obstruction or inflammation. Normal air-filled appendix. Moderate stool retention within the right colon and cecum. Scattered colonic diverticulosis is identified without bowel obstruction, abscess or perforation. The sigmoid colon is somewhat thickened in appearance in part due to underdistention. The possibility of a mild colitis is not  entirely excluded. Vascular/Lymphatic: Nonaneurysmal abdominal aorta. No lymphadenopathy. Reproductive: Hysterectomy.  No adnexal mass. Other: No free air or free fluid. 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 No acute nor aggressive osseous lesions. Osteoarthritis of both hip joints with  joint space narrowing. IMPRESSION: 1. Scattered colonic diverticulosis without acute diverticulitis. Moderate stool retention within the right colon and cecum. Transmural thickening of the sigmoid in part due to underdistention. Colitis is not entirely excluded. 2. Fullness of both renal collecting systems without obstructive uropathy. Calcifications along the course of the right ureter are felt to be secondary to adjacent phleboliths. 3. Steatosis of the liver without acute abnormality. 4. Degenerative disc disease L4-5 and L5-S1. Electronically Signed   By: Ashley Royalty M.D.   On: 07/10/2018 22:36    Procedures Procedures (including critical care time)  Medications Ordered in ED Medications  0.9 %  sodium chloride infusion ( Intravenous Stopped 07/10/18 2311)  potassium chloride 10 mEq in 100 mL IVPB ( Intravenous Stopped 07/10/18 2149)  sodium chloride 0.9 % bolus 500 mL (0 mLs Intravenous Stopped 07/10/18 2311)  iopamidol (ISOVUE-300) 61 % injection 100 mL (100 mLs Intravenous Contrast Given 07/10/18 2157)     Initial Impression / Assessment and Plan / ED Course  I have reviewed the triage vital signs and the nursing notes.  Pertinent labs & imaging results that were available during my care of the patient were reviewed by me and considered in my medical decision making (see chart for details).     Presenting for evaluation of abdominal pain and diarrhea.  Physical exam shows patient who is afebrile not tachycardic.  Appears nontoxic.  However, concerning history of many weeks of diarrhea as well as worsening symptoms today.  Patient with focal left lower quadrant abdominal pain, concern  for diverticulitis.  Pelvic exam abnormal, as patient has discharge that appears stool-like.  Concern for possible fistula.  Labs with low potassium at 2.7, will replenish via IV and p.o. at home.  Otherwise, labs reassuring, no leukocytosis.  Kidney, liver, pancreatic function reassuring.  CT pending.  CT with scattered diverticulosis without acute diverticulitis.  Colitis is not excluded, however transmural thickening of the sigmoid is thought to be due to under distention.  Additionally, patient with fullness of bilateral renal collecting systems, but no obvious cause.  Case discussed with attending, Dr. Laverta Baltimore agrees to plan.  Will replenish potassium orally at home.  Imodium for diarrhea, as this is likely the cause of hypokalemia.  Patient encouraged to follow-up with GI for her colonoscopy and further evaluation.  Discussed findings with patient, including concerned about fistula.  Discussed use of Tylenol, heating pads, and Bentyl for pain control.  At this time, patient appears safe for discharge.  Return precautions given.  Patient states she understands and agrees to plan.  Final Clinical Impressions(s) / ED Diagnoses   Final diagnoses:  Diverticulosis  Hypokalemia  Diarrhea, unspecified type    ED Discharge Orders         Ordered    potassium chloride 20 MEQ TBCR  2 times daily     07/10/18 2319    loperamide (IMODIUM) 2 MG capsule  3 times daily PRN     07/10/18 2319           Franchot Heidelberg, PA-C 07/10/18 2339    Margette Fast, MD 07/11/18 1614

## 2018-07-12 LAB — GC/CHLAMYDIA PROBE AMP (~~LOC~~) NOT AT ARMC
Chlamydia: NEGATIVE
Neisseria Gonorrhea: NEGATIVE

## 2018-07-26 ENCOUNTER — Other Ambulatory Visit: Payer: Self-pay

## 2018-07-26 ENCOUNTER — Encounter (HOSPITAL_COMMUNITY): Payer: Self-pay | Admitting: *Deleted

## 2018-07-26 ENCOUNTER — Emergency Department (HOSPITAL_COMMUNITY)
Admission: EM | Admit: 2018-07-26 | Discharge: 2018-07-26 | Disposition: A | Discharge status: Home / Self Care | Payer: Medicare HMO | Attending: Emergency Medicine | Admitting: Emergency Medicine

## 2018-07-26 DIAGNOSIS — R42 Dizziness and giddiness: Secondary | ICD-10-CM | POA: Insufficient documentation

## 2018-07-26 DIAGNOSIS — Z79899 Other long term (current) drug therapy: Secondary | ICD-10-CM | POA: Diagnosis not present

## 2018-07-26 DIAGNOSIS — E876 Hypokalemia: Secondary | ICD-10-CM | POA: Diagnosis not present

## 2018-07-26 DIAGNOSIS — R0789 Other chest pain: Secondary | ICD-10-CM | POA: Diagnosis not present

## 2018-07-26 DIAGNOSIS — R112 Nausea with vomiting, unspecified: Secondary | ICD-10-CM | POA: Diagnosis not present

## 2018-07-26 LAB — BASIC METABOLIC PANEL
Anion gap: 13 (ref 5–15)
BUN: 11 mg/dL (ref 8–23)
CO2: 27 mmol/L (ref 22–32)
Calcium: 8.6 mg/dL — ABNORMAL LOW (ref 8.9–10.3)
Chloride: 103 mmol/L (ref 98–111)
Creatinine, Ser: 0.69 mg/dL (ref 0.44–1.00)
GFR calc non Af Amer: >60 mL/min (ref >60)
Glucose, Bld: 95 mg/dL (ref 70–99)
Potassium: 2.9 mmol/L — ABNORMAL LOW (ref 3.5–5.1)
Sodium: 143 mmol/L (ref 135–145)

## 2018-07-26 LAB — CBC WITH DIFFERENTIAL/PLATELET
Abs Immature Granulocytes: 0.05 10*3/uL (ref 0.00–0.07)
BASOS ABS: 0 10*3/uL (ref 0.0–0.1)
Basophils Relative: 0 %
EOS PCT: 0 %
Eosinophils Absolute: 0 10*3/uL (ref 0.0–0.5)
HCT: 40.7 % (ref 36.0–46.0)
Hemoglobin: 13.1 g/dL (ref 12.0–15.0)
Immature Granulocytes: 1 %
Lymphocytes Relative: 14 %
Lymphs Abs: 1.5 10*3/uL (ref 0.7–4.0)
MCH: 27.3 pg (ref 26.0–34.0)
MCHC: 32.2 g/dL (ref 30.0–36.0)
MCV: 84.8 fL (ref 80.0–100.0)
Monocytes Absolute: 0.5 10*3/uL (ref 0.1–1.0)
Monocytes Relative: 4 %
NRBC: 0 % (ref 0.0–0.2)
Neutro Abs: 8.7 10*3/uL — ABNORMAL HIGH (ref 1.7–7.7)
Neutrophils Relative %: 81 %
Platelets: 257 10*3/uL (ref 150–400)
RBC: 4.8 MIL/uL (ref 3.87–5.11)
RDW: 14.6 % (ref 11.5–15.5)
WBC: 10.8 10*3/uL — ABNORMAL HIGH (ref 4.0–10.5)

## 2018-07-26 LAB — I-STAT TROPONIN, ED: Troponin i, poc: 0 ng/mL (ref 0.00–0.08)

## 2018-07-26 MED ORDER — POTASSIUM CHLORIDE CRYS ER 20 MEQ PO TBCR
40.0000 meq | EXTENDED_RELEASE_TABLET | Freq: Once | ORAL | Status: AC
  Administered 2018-07-26: 40 meq via ORAL
  Filled 2018-07-26: qty 2

## 2018-07-26 MED ORDER — SODIUM CHLORIDE 0.9 % IV BOLUS
1000.0000 mL | Freq: Once | INTRAVENOUS | Status: AC
  Administered 2018-07-26: 1000 mL via INTRAVENOUS

## 2018-07-26 MED ORDER — POTASSIUM CHLORIDE ER 20 MEQ PO TBCR: 20.0000 meq | EXTENDED_RELEASE_TABLET | Freq: Two times a day (BID) | ORAL | 0 refills | Status: DC

## 2018-07-26 MED ORDER — MECLIZINE HCL 25 MG PO TABS
25.0000 mg | ORAL_TABLET | Freq: Once | ORAL | Status: AC
  Administered 2018-07-26: 25 mg via ORAL
  Filled 2018-07-26: qty 1

## 2018-07-26 MED ORDER — ONDANSETRON HCL 4 MG/2ML IJ SOLN
4.0000 mg | Freq: Once | INTRAMUSCULAR | Status: AC
  Administered 2018-07-26: 4 mg via INTRAVENOUS
  Filled 2018-07-26: qty 2

## 2018-07-26 MED ORDER — ONDANSETRON 4 MG PO TBDP: 4.0000 mg | ORAL_TABLET | Freq: Three times a day (TID) | ORAL | 0 refills | Status: DC | PRN

## 2018-07-26 MED ORDER — MECLIZINE HCL 12.5 MG PO TABS: 12.5000 mg | ORAL_TABLET | Freq: Three times a day (TID) | ORAL | 0 refills | Status: DC | PRN

## 2018-07-26 NOTE — ED Triage Notes (Signed)
Pt bib EMS and presents with dizziness, n/v. Pt reported to EMS that pt's symptoms began at around 13:00. Pt was at the stove and pt became dizzy and weak.  Pt reports she then got n/v.  Pt hx of vertigo and has not been taking her medications for that but pt states that her dizziness today is different from her past episodes of vertigo. In December pt has been dealing a diverticulitis flare up in which pt has been getting K infusions.  Pt a/o x 4.

## 2018-07-26 NOTE — ED Provider Notes (Signed)
[facility] DEPT Provider Note   CSN: [id number] Arrival date & time: 07/26/18  1354     History   Chief Complaint Chief Complaint  Patient presents with  . Dizziness  . Nausea  . Emesis    HPI Shannon Farrell is a 68 y.o. female w PMHx IBS, diverticulosis, breast cancer, anxiety, peripheral vertigo, presenting to the ED with complaint of dizziness that began this morning. Pt reports she woke up early this morning around 4am with a brief episode of left sided chest pain that lasted a minute or so. Felt similar to prior hx of GERD and treated with antacids. She states she went downtown for an appointment and was feeling well.  Upon arriving home to her apartment, she began feel somewhat lightheaded while climbing her stairs.  She states she thought her blood sugar was low because she did not eat breakfast and began cooking herself a meal.  She states when standing at this time she became more lightheaded as if she were to pass out.  She states she then walked to the couch to lay down.  She states with the lightheadedness she felt tingling sensation to bilateral arms.  When she laid down, she began feeling room spinning dizziness with nausea, similar to her prior history of vertigo.  She states she then called EMS.  She states the room spinning dizziness has been waxing and waning since arrival, with associated nausea and vomiting.  She states she feels okay while sitting upright and not moving her head, however if she lays down or moves positions the dizziness worsens.  Denies unilateral weakness or numbness, vision changes, headache.  States she has been dealing with diarrhea since last visit in the ED on 07/10/2018.  During that visit, she was diagnosed with diverticulosis without diverticulitis, as well as hypokalemia.  She is been receiving potassium replacement on outpatient basis.  She is still been having 2-3 episodes of diarrhea per day, last episode was last  night.  States she feels somewhat dehydrated.  No abdominal pain or current urinary symptoms.  She has a colonoscopy scheduled for Tuesday. States she has longstanding history of peripheral vertigo, which she treats with meclizine.  Has not taken any meclizine recently. Per chart review, patient was evaluated on 07/10/2018 with a CT scan revealing diverticulosis without acute diverticulitis, findings possibly suspicious for colitis.  She was discharged with symptomatic management and potassium replacement.  She is followed up with her PCP since that time.  The history is provided by the patient and medical records.    Past Medical History:  Diagnosis Date  . Anxiety   . Cancer (HCC)    breast  . Irritable bowel syndrome   . Thyroid disease     There are no active problems to display for this patient.   Past Surgical History:  Procedure Laterality Date  . ABDOMINAL HYSTERECTOMY     partial  . BREAST LUMPECTOMY       OB History   No obstetric history on file.      Home Medications    Prior to Admission medications   Medication Sig Start Date End Date Taking? Authorizing Provider  acetaminophen (TYLENOL) 500 MG tablet Take 500 mg by mouth every 6 (six) hours as needed for moderate pain.   Yes [provider]  cetirizine (ZYRTEC ALLERGY) 10 MG tablet Take 10 mg by mouth daily.   Yes [provider]  cetirizine (ZYRTEC) 10 MG tablet Take 10 mg  by mouth daily.   Yes [provider]  Cholecalciferol (VITAMIN D) 50 MCG (2000 UT) tablet Take 4,000 Units by mouth daily.   Yes [provider]  cyclobenzaprine (FLEXERIL) 10 MG tablet Take by mouth 3 (three) times daily as needed for muscle spasms.   Yes [provider]  diazepam (VALIUM) 2 MG tablet Take 1 tablet (2 mg total) by mouth every 8 (eight) hours as needed (for vertigo; may cause drowsiness). 11/13/16  Yes Molpus, John, MD  diclofenac (VOLTAREN) 50 MG EC tablet Take 50 mg by mouth 2  (two) times daily as needed for mild pain.    Yes [provider]  dicyclomine (BENTYL) 10 MG capsule Take 10 mg by mouth 4 (four) times daily -  before meals and at bedtime.   Yes [provider]  ibuprofen (ADVIL,MOTRIN) 800 MG tablet Take 1 tablet (800 mg total) by mouth 3 (three) times daily. Patient taking differently: Take 400 mg by mouth every 8 (eight) hours as needed for moderate pain.  02/18/17  Yes Mackuen, Courteney Lyn, MD  levothyroxine (SYNTHROID, LEVOTHROID) 75 MCG tablet Take 75 mcg by mouth daily before breakfast.   Yes [provider]  loperamide (IMODIUM) 2 MG capsule Take 1 capsule (2 mg total) by mouth 3 (three) times daily as needed for diarrhea or loose stools. 07/10/18  Yes Caccavale, Sophia, PA-C  omeprazole (PRILOSEC) 40 MG capsule Take 40 mg by mouth daily.   Yes [provider]  aspirin EC 81 MG tablet Take 81 mg by mouth daily.    [provider]  cyclobenzaprine (FLEXERIL) 10 MG tablet Take 1 tablet (10 mg total) by mouth 2 (two) times daily as needed for muscle spasms. Patient not taking: Reported on 07/26/2018 02/18/17   Macarthur Critchley, MD  hydrocortisone cream 1 % Apply to affected area 2 times daily as needed for itching/redness for up to 4 days Patient not taking: Reported on 07/26/2018 06/10/18   Little, Wenda Overland, MD  meclizine (ANTIVERT) 12.5 MG tablet Take 1 tablet (12.5 mg total) by mouth 3 (three) times daily as needed for dizziness. 07/26/18   Robinson, [place] N, PA-C  ondansetron (ZOFRAN ODT) 4 MG disintegrating tablet Take 1 tablet (4 mg total) by mouth every 8 (eight) hours as needed for nausea or vomiting. 07/26/18   Robinson, [place] N, PA-C  Potassium Chloride ER 20 MEQ TBCR Take 20 mEq by mouth 2 (two) times daily for 5 days. 07/26/18 07/31/18  Robinson, [place] N, PA-C    Family History No family history on file.  Social History Social History   Tobacco Use  . Smoking status: Never Smoker  . Smokeless  tobacco: Never Used  Substance Use Topics  . Alcohol use: Never    Frequency: Never  . Drug use: Never     Allergies   Codeine; Tramadol; Vicodin [hydrocodone-acetaminophen]; and Penicillins   Review of Systems Review of Systems  Constitutional: Negative for fever.  Respiratory: Negative for shortness of breath.   Cardiovascular: Positive for chest pain (brief, Resolved).  Gastrointestinal: Positive for diarrhea, nausea and vomiting. Negative for abdominal pain.  Neurological: Positive for dizziness and light-headedness. Negative for syncope, facial asymmetry, numbness and headaches.  Hematological: Does not bruise/bleed easily.  Psychiatric/Behavioral: Negative for confusion.  All other systems reviewed and are negative.    Physical Exam Updated Vital Signs BP 124/74   Pulse 67   Temp 98 F (36.7 C)   Resp (!) 9   SpO2 98%  Physical Exam Vitals signs and nursing note reviewed.  Constitutional:      Appearance: She is well-developed. She is not ill-appearing or toxic-appearing.  HENT:     Head: Normocephalic and atraumatic.  Eyes:     Extraocular Movements: Extraocular movements intact.     Conjunctiva/sclera: Conjunctivae normal.     Pupils: Pupils are equal, round, and reactive to light.  Cardiovascular:     Rate and Rhythm: Normal rate and regular rhythm.     Heart sounds: Normal heart sounds.  Pulmonary:     Effort: Pulmonary effort is normal.     Breath sounds: Normal breath sounds.  Abdominal:     General: A surgical scar is present. Bowel sounds are normal.     Palpations: Abdomen is soft. There is no mass.     Tenderness: There is no abdominal tenderness. There is no guarding.  Skin:    General: Skin is warm.  Neurological:     Mental Status: She is alert.     Sensory: No sensory deficit.     Comments: Mental Status:  Alert, oriented, thought content appropriate, able to give a coherent history. Speech fluent without evidence of aphasia. Able to  follow 2 step commands without difficulty.  Cranial Nerves:  II:  Peripheral visual fields grossly normal, pupils equal, round, reactive to light III,IV, VI: ptosis not present, extra-ocular motions intact bilaterally  V,VII: smile symmetric, facial light touch sensation equal VIII: hearing grossly normal to voice  X: uvula elevates symmetrically  XI: bilateral shoulder shrug symmetric and strong XII: midline tongue extension without fassiculations Motor:  Normal tone. 5/5 in upper and lower extremities bilaterally including strong and equal grip strength and dorsiflexion/plantar flexion  Cerebellar: normal finger-to-nose with bilateral upper extremities CV: distal pulses palpable throughout    Psychiatric:        Mood and Affect: Mood normal.        Behavior: Behavior normal.      ED Treatments / Results  Labs (all labs ordered are listed, but only abnormal results are displayed) Labs Reviewed  BASIC METABOLIC PANEL - Abnormal; Notable for the following components:      Result Value   Potassium 2.9 (*)    Calcium 8.6 (*)    All other components within normal limits  CBC WITH DIFFERENTIAL/PLATELET - Abnormal; Notable for the following components:   WBC 10.8 (*)    Neutro Abs 8.7 (*)    All other components within normal limits  I-STAT TROPONIN, ED    EKG EKG Interpretation  Date/Time:  Thursday July 26 2018 15:36:43 EST Ventricular Rate:  71 PR Interval:    QRS Duration: 98 QT Interval:  421 QTC Calculation: 458 R Axis:   1 Text Interpretation:  Sinus rhythm Nonspecific T wave abnormality Confirmed by Lajean Saver [phone]) on 07/26/2018 3:42:04 PM   Radiology No results found.  Procedures Procedures (including critical care time)  Medications Ordered in ED Medications  ondansetron (ZOFRAN) injection 4 mg (4 mg Intravenous Given 07/26/18 1901)  sodium chloride 0.9 % bolus 1,000 mL (0 mLs Intravenous Stopped 07/26/18 2008)  meclizine (ANTIVERT) tablet 25 mg (25  mg Oral Given 07/26/18 1609)  potassium chloride SA (K-DUR,KLOR-CON) CR tablet 40 mEq (40 mEq Oral Given 07/26/18 2008)     Initial Impression / Assessment and Plan / ED Course  I have reviewed the triage vital signs and the nursing notes.  Pertinent labs & imaging results that were available during my care of the patient  were reviewed by me and considered in my medical decision making (see chart for details).     Patient with prior history of peripheral vertigo, presenting to the ED with room spinning dizziness and nausea and vomiting.  Dizziness is positional, reproduced on exam.  Normal neurologic exam.  No vertical or rotational nystagmus.  Doubt CVA or other central cause of vertigo.  History and physical consistent with peripheral vertigo symptoms.  Labs without signs of dehydration.  Cardiac enzymes normal.  EKG without changes.  Treated in the ED with meclizine, Zofran, IV fluids.  Ambulated without assistance. Patient instructed to followup with her primary care physician or neurology within 3 days for further evaluation. They are to return to the emergency department for new neurologic symptoms, loss of vision or other concerning symptoms.  Patient discussed with and seen by Dr. Ashok Cordia.  Discussed results, findings, treatment and follow up. Patient advised of return precautions. Patient verbalized understanding and agreed with plan.  Final Clinical Impressions(s) / ED Diagnoses   Final diagnoses:  Vertigo  Hypokalemia    ED Discharge Orders         Ordered    Potassium Chloride ER 20 MEQ TBCR  2 times daily     07/26/18 2013    ondansetron (ZOFRAN ODT) 4 MG disintegrating tablet  Every 8 hours PRN     07/26/18 2038    meclizine (ANTIVERT) 12.5 MG tablet  3 times daily PRN     07/26/18 2040           Robinson, [place] N, PA-C 07/26/18 2210    Lajean Saver, MD 07/26/18 2250

## 2018-07-26 NOTE — Discharge Instructions (Signed)
Follow-up with your primary care provider regarding your visit today. Stay hydrated. You can take your meclizine as prescribed for dizziness. You can take Zofran every 8 hours as needed for nausea.

## 2018-08-13 ENCOUNTER — Emergency Department (HOSPITAL_BASED_OUTPATIENT_CLINIC_OR_DEPARTMENT_OTHER)
Admission: EM | Admit: 2018-08-13 | Discharge: 2018-08-13 | Disposition: A | Discharge status: Home / Self Care | Payer: Medicare HMO | Attending: Emergency Medicine | Admitting: Emergency Medicine

## 2018-08-13 ENCOUNTER — Encounter (HOSPITAL_BASED_OUTPATIENT_CLINIC_OR_DEPARTMENT_OTHER): Payer: Self-pay | Admitting: Emergency Medicine

## 2018-08-13 ENCOUNTER — Other Ambulatory Visit: Payer: Self-pay

## 2018-08-13 ENCOUNTER — Emergency Department (HOSPITAL_BASED_OUTPATIENT_CLINIC_OR_DEPARTMENT_OTHER): Payer: Medicare HMO

## 2018-08-13 DIAGNOSIS — Z79899 Other long term (current) drug therapy: Secondary | ICD-10-CM | POA: Insufficient documentation

## 2018-08-13 DIAGNOSIS — R079 Chest pain, unspecified: Secondary | ICD-10-CM | POA: Diagnosis present

## 2018-08-13 DIAGNOSIS — Z7982 Long term (current) use of aspirin: Secondary | ICD-10-CM | POA: Insufficient documentation

## 2018-08-13 LAB — BASIC METABOLIC PANEL
Anion gap: 11 (ref 5–15)
BUN: 9 mg/dL (ref 8–23)
CO2: 24 mmol/L (ref 22–32)
Calcium: 8.4 mg/dL — ABNORMAL LOW (ref 8.9–10.3)
Chloride: 101 mmol/L (ref 98–111)
Creatinine, Ser: 0.69 mg/dL (ref 0.44–1.00)
GFR calc Af Amer: >60 mL/min (ref >60)
GFR calc non Af Amer: >60 mL/min (ref >60)
Glucose, Bld: 89 mg/dL (ref 70–99)
POTASSIUM: 3.5 mmol/L (ref 3.5–5.1)
Sodium: 136 mmol/L (ref 135–145)

## 2018-08-13 LAB — CBC
HCT: 40.9 % (ref 36.0–46.0)
Hemoglobin: 13.1 g/dL (ref 12.0–15.0)
MCH: 27.1 pg (ref 26.0–34.0)
MCHC: 32 g/dL (ref 30.0–36.0)
MCV: 84.5 fL (ref 80.0–100.0)
Platelets: 271 10*3/uL (ref 150–400)
RBC: 4.84 MIL/uL (ref 3.87–5.11)
RDW: 14.7 % (ref 11.5–15.5)
WBC: 9.5 10*3/uL (ref 4.0–10.5)
nRBC: 0 % (ref 0.0–0.2)

## 2018-08-13 LAB — TROPONIN I

## 2018-08-13 MED ORDER — LIDOCAINE VISCOUS HCL 2 % MT SOLN
15.0000 mL | Freq: Once | OROMUCOSAL | Status: AC
  Administered 2018-08-13: 15 mL via ORAL
  Filled 2018-08-13: qty 15

## 2018-08-13 MED ORDER — ACETAMINOPHEN 325 MG PO TABS
650.0000 mg | ORAL_TABLET | Freq: Once | ORAL | Status: AC
  Administered 2018-08-13: 650 mg via ORAL
  Filled 2018-08-13: qty 2

## 2018-08-13 MED ORDER — ALUM & MAG HYDROXIDE-SIMETH 200-200-20 MG/5ML PO SUSP
30.0000 mL | Freq: Once | ORAL | Status: AC
  Administered 2018-08-13: 30 mL via ORAL
  Filled 2018-08-13: qty 30

## 2018-08-13 MED ORDER — SODIUM CHLORIDE 0.9% FLUSH
3.0000 mL | Freq: Once | INTRAVENOUS | Status: DC
  Filled 2018-08-13: qty 3

## 2018-08-13 NOTE — ED Triage Notes (Signed)
Reports chest pain which began 10-15 minutes PTA.  States that she has been crushing her extended release potassium pills since yesterday and believes this is the cause.  Reports shortness of breath.

## 2018-08-13 NOTE — ED Notes (Signed)
ED Provider at bedside. 

## 2018-08-13 NOTE — ED Provider Notes (Signed)
[facility] EMERGENCY DEPARTMENT Provider Note   CSN: [id number] Arrival date & time: 08/13/18  1734     History   Chief Complaint Chief Complaint  Patient presents with  . Chest Pain    HPI Shannon Farrell is a 68 y.o. female.  She is here with a complaint of chest aching substernal and left-sided that she said began around 4 PM.  She thinks it may have to do with the fact that she has been breaking up her long-acting potassium tablets.  She seems to be taking potassium for low potassium that is happening because of the diarrhea.  Her doctors prescribed her some new potassium tablets but she was worried about this pain so she came here.  She said she is never had this before.  She has reflux but she says that pain is different.  She said it was 8 out of 10 at maximum and is improved now to 6 out of 10.  It was associated with some shortness of breath but no diaphoresis numbness weakness nausea or vomiting.  She is tried nothing for it.  The history is provided by the patient.  Chest Pain  Pain location:  Substernal area and L chest Pain quality: aching   Pain radiates to:  Does not radiate Pain severity:  Moderate Onset quality:  Sudden Duration:  2 hours Timing:  Constant Progression:  Improving Chronicity:  New Relieved by:  Nothing Worsened by:  Nothing Ineffective treatments:  None tried Associated symptoms: heartburn and shortness of breath   Associated symptoms: no abdominal pain, no back pain, no cough, no fever, no headache, no lower extremity edema, no nausea and no vomiting     Past Medical History:  Diagnosis Date  . Anxiety   . Cancer (HCC)    breast  . Irritable bowel syndrome   . Thyroid disease     There are no active problems to display for this patient.   Past Surgical History:  Procedure Laterality Date  . ABDOMINAL HYSTERECTOMY     partial  . BREAST LUMPECTOMY       OB History   No obstetric history on file.      Home  Medications    Prior to Admission medications   Medication Sig Start Date End Date Taking? Authorizing Provider  acetaminophen (TYLENOL) 500 MG tablet Take 500 mg by mouth every 6 (six) hours as needed for moderate pain.    [provider]  aspirin EC 81 MG tablet Take 81 mg by mouth daily.    [provider]  cetirizine (ZYRTEC ALLERGY) 10 MG tablet Take 10 mg by mouth daily.    [provider]  cetirizine (ZYRTEC) 10 MG tablet Take 10 mg by mouth daily.    [provider]  Cholecalciferol (VITAMIN D) 50 MCG (2000 UT) tablet Take 4,000 Units by mouth daily.    [provider]  cyclobenzaprine (FLEXERIL) 10 MG tablet Take by mouth 3 (three) times daily as needed for muscle spasms.    [provider]  cyclobenzaprine (FLEXERIL) 10 MG tablet Take 1 tablet (10 mg total) by mouth 2 (two) times daily as needed for muscle spasms. Patient not taking: Reported on 07/26/2018 02/18/17   Mackuen, Courteney Lyn, MD  diazepam (VALIUM) 2 MG tablet Take 1 tablet (2 mg total) by mouth every 8 (eight) hours as needed (for vertigo; may cause drowsiness). 11/13/16   Molpus, John, MD  diclofenac (VOLTAREN) 50 MG EC tablet Take 50 mg  by mouth 2 (two) times daily as needed for mild pain.     [provider]  dicyclomine (BENTYL) 10 MG capsule Take 10 mg by mouth 4 (four) times daily -  before meals and at bedtime.    [provider]  hydrocortisone cream 1 % Apply to affected area 2 times daily as needed for itching/redness for up to 4 days Patient not taking: Reported on 07/26/2018 06/10/18   Little, Wenda Overland, MD  ibuprofen (ADVIL,MOTRIN) 800 MG tablet Take 1 tablet (800 mg total) by mouth 3 (three) times daily. Patient taking differently: Take 400 mg by mouth every 8 (eight) hours as needed for moderate pain.  02/18/17   Mackuen, Courteney Lyn, MD  levothyroxine (SYNTHROID, LEVOTHROID) 75 MCG tablet Take 75 mcg by mouth daily before breakfast.     [provider]  loperamide (IMODIUM) 2 MG capsule Take 1 capsule (2 mg total) by mouth 3 (three) times daily as needed for diarrhea or loose stools. 07/10/18   Caccavale, Sophia, PA-C  meclizine (ANTIVERT) 12.5 MG tablet Take 1 tablet (12.5 mg total) by mouth 3 (three) times daily as needed for dizziness. 07/26/18   Robinson, [place] N, PA-C  omeprazole (PRILOSEC) 40 MG capsule Take 40 mg by mouth daily.    [provider]  ondansetron (ZOFRAN ODT) 4 MG disintegrating tablet Take 1 tablet (4 mg total) by mouth every 8 (eight) hours as needed for nausea or vomiting. 07/26/18   Robinson, [place] N, PA-C  Potassium Chloride ER 20 MEQ TBCR Take 20 mEq by mouth 2 (two) times daily for 5 days. 07/26/18 07/31/18  Robinson, [place] N, PA-C    Family History History reviewed. No pertinent family history.  Social History Social History   Tobacco Use  . Smoking status: Never Smoker  . Smokeless tobacco: Never Used  Substance Use Topics  . Alcohol use: Never    Frequency: Never  . Drug use: Never     Allergies   Codeine; Tramadol; Vicodin [hydrocodone-acetaminophen]; and Penicillins   Review of Systems Review of Systems  Constitutional: Negative for fever.  HENT: Negative for sore throat.   Eyes: Negative for visual disturbance.  Respiratory: Positive for shortness of breath. Negative for cough.   Cardiovascular: Positive for chest pain.  Gastrointestinal: Positive for heartburn. Negative for abdominal pain, nausea and vomiting.  Genitourinary: Negative for dysuria.  Musculoskeletal: Negative for back pain.  Skin: Negative for rash.  Neurological: Negative for headaches.     Physical Exam Updated Vital Signs BP (!) 141/86   Pulse 71   Temp 97.6 F (36.4 C) (Oral)   Resp 14   Ht 5' 4.5" (1.638 m)   Wt 63.5 kg   SpO2 100%   BMI 23.66 kg/m   Physical Exam Vitals signs and nursing note reviewed.  Constitutional:      General: She is not in acute distress.     Appearance: She is well-developed.  HENT:     Head: Normocephalic and atraumatic.  Eyes:     Conjunctiva/sclera: Conjunctivae normal.  Neck:     Musculoskeletal: Neck supple.  Cardiovascular:     Rate and Rhythm: Normal rate and regular rhythm.     Heart sounds: No murmur.  Pulmonary:     Effort: Pulmonary effort is normal. No respiratory distress.     Breath sounds: Normal breath sounds.  Abdominal:     Palpations: Abdomen is soft.     Tenderness: There is no abdominal tenderness.  Musculoskeletal: Normal range  of motion.     Right lower leg: She exhibits no tenderness. No edema.     Left lower leg: She exhibits no tenderness. No edema.  Skin:    General: Skin is warm and dry.     Capillary Refill: Capillary refill takes less than 2 seconds.  Neurological:     General: No focal deficit present.     Mental Status: She is alert and oriented to person, place, and time.     Motor: No weakness.      ED Treatments / Results  Labs (all labs ordered are listed, but only abnormal results are displayed) Labs Reviewed  BASIC METABOLIC PANEL - Abnormal; Notable for the following components:      Result Value   Calcium 8.4 (*)    All other components within normal limits  CBC  TROPONIN I  TROPONIN I    EKG EKG Interpretation  Date/Time:  Monday August 13 2018 17:39:18 EST Ventricular Rate:  73 PR Interval:  154 QRS Duration: 74 QT Interval:  400 QTC Calculation: 440 R Axis:   45 Text Interpretation:  Normal sinus rhythm Nonspecific ST and T wave abnormality Abnormal ECG similar pattern to prior 1/20 Confirmed by Aletta Edouard [phone]) on 08/13/2018 6:24:36 PM   Radiology Dg Chest 2 View  Result Date: 08/13/2018 CLINICAL DATA:  Left-sided chest pain and shortness of breath. EXAM: CHEST - 2 VIEW COMPARISON:  Chest x-ray dated January 01, 2018. FINDINGS: The heart size and mediastinal contours are within normal limits. Normal pulmonary vascularity. No focal consolidation,  pleural effusion, or pneumothorax. Unchanged linear scarring/atelectasis of the left lung base. No acute osseous abnormality. IMPRESSION: No active cardiopulmonary disease. Electronically Signed   By: Titus Dubin M.D.   On: 08/13/2018 18:09    Procedures Procedures (including critical care time)  Medications Ordered in ED Medications  alum & mag hydroxide-simeth (MAALOX/MYLANTA) 200-200-20 MG/5ML suspension 30 mL (30 mLs Oral Given 08/13/18 1948)    And  lidocaine (XYLOCAINE) 2 % viscous mouth solution 15 mL (15 mLs Oral Given 08/13/18 1948)  acetaminophen (TYLENOL) tablet 650 mg (650 mg Oral Given 08/13/18 2009)     Initial Impression / Assessment and Plan / ED Course  I have reviewed the triage vital signs and the nursing notes.  Pertinent labs & imaging results that were available during my care of the patient were reviewed by me and considered in my medical decision making (see chart for details).  Clinical Course as of Aug 13 2336  Mon Aug 13, 2018  2241 Patient had a GI cocktail with some relief.  Delta troponin negative.  Will discharge and have follow-up with PCP.   [MB]    Clinical Course User Index [MB] Hayden Rasmussen, MD     Final Clinical Impressions(s) / ED Diagnoses   Final diagnoses:  Nonspecific chest pain    ED Discharge Orders    None       Hayden Rasmussen, MD 08/13/18 2338

## 2018-08-13 NOTE — Discharge Instructions (Addendum)
You were seen in the emergency department for some chest pain.  You had blood work EKG and chest x-ray that did not show an obvious cause of your pain.  This is likely related to some reflux or some inflammation of your esophagus due to the potassium pills.  You should use Maalox in between meals and at bedtime.  Please follow-up with your doctor regarding this.  Return if any concerns.

## 2018-08-13 NOTE — ED Notes (Signed)
Pt calm, smiling talkative. No sob. Skin warm and dry.

## 2019-01-24 ENCOUNTER — Observation Stay (HOSPITAL_BASED_OUTPATIENT_CLINIC_OR_DEPARTMENT_OTHER)
Admission: EM | Admit: 2019-01-24 | Discharge: 2019-01-26 | Disposition: A | Discharge status: Home / Self Care | Payer: Medicare HMO | Attending: Internal Medicine | Admitting: Internal Medicine

## 2019-01-24 ENCOUNTER — Other Ambulatory Visit: Payer: Self-pay

## 2019-01-24 ENCOUNTER — Encounter (HOSPITAL_BASED_OUTPATIENT_CLINIC_OR_DEPARTMENT_OTHER): Payer: Self-pay | Admitting: *Deleted

## 2019-01-24 DIAGNOSIS — E876 Hypokalemia: Secondary | ICD-10-CM | POA: Diagnosis not present

## 2019-01-24 DIAGNOSIS — Z7982 Long term (current) use of aspirin: Secondary | ICD-10-CM | POA: Diagnosis not present

## 2019-01-24 DIAGNOSIS — J189 Pneumonia, unspecified organism: Secondary | ICD-10-CM | POA: Diagnosis not present

## 2019-01-24 DIAGNOSIS — E039 Hypothyroidism, unspecified: Secondary | ICD-10-CM | POA: Diagnosis present

## 2019-01-24 DIAGNOSIS — Z853 Personal history of malignant neoplasm of breast: Secondary | ICD-10-CM | POA: Diagnosis not present

## 2019-01-24 DIAGNOSIS — R42 Dizziness and giddiness: Principal | ICD-10-CM

## 2019-01-24 DIAGNOSIS — Z20828 Contact with and (suspected) exposure to other viral communicable diseases: Secondary | ICD-10-CM | POA: Insufficient documentation

## 2019-01-24 DIAGNOSIS — Z79899 Other long term (current) drug therapy: Secondary | ICD-10-CM | POA: Diagnosis not present

## 2019-01-24 LAB — BASIC METABOLIC PANEL
Anion gap: 11 (ref 5–15)
BUN: 12 mg/dL (ref 8–23)
CO2: 24 mmol/L (ref 22–32)
Calcium: 7.3 mg/dL — ABNORMAL LOW (ref 8.9–10.3)
Chloride: 106 mmol/L (ref 98–111)
Creatinine, Ser: 0.62 mg/dL (ref 0.44–1.00)
GFR calc Af Amer: >60 mL/min (ref >60)
GFR calc non Af Amer: >60 mL/min (ref >60)
Glucose, Bld: 107 mg/dL — ABNORMAL HIGH (ref 70–99)
Potassium: 2.9 mmol/L — ABNORMAL LOW (ref 3.5–5.1)
Sodium: 141 mmol/L (ref 135–145)

## 2019-01-24 LAB — CBC WITH DIFFERENTIAL/PLATELET
Abs Immature Granulocytes: 0.04 10*3/uL (ref 0.00–0.07)
Basophils Absolute: 0.1 10*3/uL (ref 0.0–0.1)
Basophils Relative: 1 %
Eosinophils Absolute: 0 10*3/uL (ref 0.0–0.5)
Eosinophils Relative: 0 %
HCT: 37.2 % (ref 36.0–46.0)
Hemoglobin: 11.9 g/dL — ABNORMAL LOW (ref 12.0–15.0)
Immature Granulocytes: 0 %
Lymphocytes Relative: 15 %
Lymphs Abs: 1.7 10*3/uL (ref 0.7–4.0)
MCH: 27.2 pg (ref 26.0–34.0)
MCHC: 32 g/dL (ref 30.0–36.0)
MCV: 84.9 fL (ref 80.0–100.0)
Monocytes Absolute: 0.6 10*3/uL (ref 0.1–1.0)
Monocytes Relative: 5 %
Neutro Abs: 8.8 10*3/uL — ABNORMAL HIGH (ref 1.7–7.7)
Neutrophils Relative %: 79 %
Platelets: 252 10*3/uL (ref 150–400)
RBC: 4.38 MIL/uL (ref 3.87–5.11)
RDW: 14.1 % (ref 11.5–15.5)
WBC: 11.1 10*3/uL — ABNORMAL HIGH (ref 4.0–10.5)
nRBC: 0 % (ref 0.0–0.2)

## 2019-01-24 LAB — URINALYSIS, ROUTINE W REFLEX MICROSCOPIC
Bilirubin Urine: NEGATIVE
Glucose, UA: NEGATIVE mg/dL
Hgb urine dipstick: NEGATIVE
Ketones, ur: 15 mg/dL — AB
Leukocytes,Ua: NEGATIVE
Nitrite: NEGATIVE
Protein, ur: NEGATIVE mg/dL
Specific Gravity, Urine: 1.02 (ref 1.005–1.030)
pH: 7.5 (ref 5.0–8.0)

## 2019-01-24 LAB — SARS CORONAVIRUS 2 AG (30 MIN TAT): SARS Coronavirus 2 Ag: NEGATIVE

## 2019-01-24 LAB — PHOSPHORUS: Phosphorus: 3.2 mg/dL (ref 2.5–4.6)

## 2019-01-24 LAB — MAGNESIUM: Magnesium: 0.6 mg/dL — CL (ref 1.7–2.4)

## 2019-01-24 MED ORDER — SODIUM CHLORIDE 0.9 % IV BOLUS
1000.0000 mL | Freq: Once | INTRAVENOUS | Status: AC
  Administered 2019-01-24: 1000 mL via INTRAVENOUS

## 2019-01-24 MED ORDER — DIAZEPAM 5 MG/ML IJ SOLN
5.0000 mg | Freq: Once | INTRAMUSCULAR | Status: AC
  Administered 2019-01-24: 5 mg via INTRAVENOUS
  Filled 2019-01-24: qty 2

## 2019-01-24 MED ORDER — MAGNESIUM SULFATE 2 GM/50ML IV SOLN
2.0000 g | Freq: Once | INTRAVENOUS | Status: AC
  Administered 2019-01-24: 2 g via INTRAVENOUS
  Filled 2019-01-24: qty 50

## 2019-01-24 MED ORDER — POTASSIUM CHLORIDE 10 MEQ/100ML IV SOLN
10.0000 meq | Freq: Once | INTRAVENOUS | Status: AC
  Administered 2019-01-24: 10 meq via INTRAVENOUS
  Filled 2019-01-24: qty 100

## 2019-01-24 MED ORDER — POTASSIUM CHLORIDE CRYS ER 20 MEQ PO TBCR
40.0000 meq | EXTENDED_RELEASE_TABLET | Freq: Once | ORAL | Status: AC
  Administered 2019-01-24: 40 meq via ORAL
  Filled 2019-01-24: qty 2

## 2019-01-24 MED ORDER — MECLIZINE HCL 25 MG PO TABS
25.0000 mg | ORAL_TABLET | Freq: Once | ORAL | Status: AC
  Administered 2019-01-24: 25 mg via ORAL
  Filled 2019-01-24: qty 1

## 2019-01-24 MED ORDER — POTASSIUM CHLORIDE 10 MEQ/100ML IV SOLN
10.0000 meq | INTRAVENOUS | Status: DC
  Administered 2019-01-24 – 2019-01-25 (×2): 10 meq via INTRAVENOUS
  Filled 2019-01-24 (×2): qty 100

## 2019-01-24 NOTE — ED Notes (Signed)
Called [facility] spoke with Tammy asked if hospitalist will transfer to [facility] instead of [facility]

## 2019-01-24 NOTE — ED Notes (Signed)
Critical lab received; magnesium 0.6; Dr Regenia Skeeter aware.

## 2019-01-24 NOTE — ED Notes (Signed)
Pt. Has been on the bedpan several times and cleaned each time.

## 2019-01-24 NOTE — ED Provider Notes (Signed)
[facility] EMERGENCY DEPARTMENT Provider Note   CSN: [id number] Arrival date & time: 01/24/19  1734    History   Chief Complaint Chief Complaint  Patient presents with  . Dizziness    HPI Shannon Farrell is a 68 y.o. female.     HPI  68 year old female with a history of vertigo presents with vertigo and vomiting.  She developed dizziness while in the car at around 3 PM.  She took [place] but it did not seem to help and the dizziness became worse and worse.  She is never had it this strong though originally it started off like typical vertigo.  She is had numerous episodes of vomiting which she states typically does not happen but has before.  No chest pain, headache, blurry vision or weakness/numbness.  Nausea is better after EMS gave antiemetics. Still feels dizzy when moving.  Past Medical History:  Diagnosis Date  . Anxiety   . Cancer (HCC)    breast  . Irritable bowel syndrome   . Thyroid disease     Patient Active Problem List   Diagnosis Date Noted  . Vertigo 01/24/2019    Past Surgical History:  Procedure Laterality Date  . ABDOMINAL HYSTERECTOMY     partial  . BREAST LUMPECTOMY       OB History   No obstetric history on file.      Home Medications    Prior to Admission medications   Medication Sig Start Date End Date Taking? Authorizing Provider  acetaminophen (TYLENOL) 500 MG tablet Take 500 mg by mouth every 6 (six) hours as needed for moderate pain.    [provider]  aspirin EC 81 MG tablet Take 81 mg by mouth daily.    [provider]  cetirizine (ZYRTEC ALLERGY) 10 MG tablet Take 10 mg by mouth daily.    [provider]  cetirizine (ZYRTEC) 10 MG tablet Take 10 mg by mouth daily.    [provider]  Cholecalciferol (VITAMIN D) 50 MCG (2000 UT) tablet Take 4,000 Units by mouth daily.    [provider]  cyclobenzaprine (FLEXERIL) 10 MG tablet Take by mouth 3 (three) times daily as needed for  muscle spasms.    [provider]  cyclobenzaprine (FLEXERIL) 10 MG tablet Take 1 tablet (10 mg total) by mouth 2 (two) times daily as needed for muscle spasms. Patient not taking: Reported on 07/26/2018 02/18/17   Mackuen, Courteney Lyn, MD  diazepam (VALIUM) 2 MG tablet Take 1 tablet (2 mg total) by mouth every 8 (eight) hours as needed (for vertigo; may cause drowsiness). 11/13/16   Molpus, John, MD  diclofenac (VOLTAREN) 50 MG EC tablet Take 50 mg by mouth 2 (two) times daily as needed for mild pain.     [provider]  dicyclomine (BENTYL) 10 MG capsule Take 10 mg by mouth 4 (four) times daily -  before meals and at bedtime.    [provider]  hydrocortisone cream 1 % Apply to affected area 2 times daily as needed for itching/redness for up to 4 days Patient not taking: Reported on 07/26/2018 06/10/18   Little, Wenda Overland, MD  ibuprofen (ADVIL,MOTRIN) 800 MG tablet Take 1 tablet (800 mg total) by mouth 3 (three) times daily. Patient taking differently: Take 400 mg by mouth every 8 (eight) hours as needed for moderate pain.  02/18/17   Mackuen, Courteney Lyn, MD  levothyroxine (SYNTHROID, LEVOTHROID) 75 MCG tablet Take 75 mcg by mouth daily before  breakfast.    [provider]  loperamide (IMODIUM) 2 MG capsule Take 1 capsule (2 mg total) by mouth 3 (three) times daily as needed for diarrhea or loose stools. 07/10/18   Caccavale, Sophia, PA-C  meclizine (ANTIVERT) 12.5 MG tablet Take 1 tablet (12.5 mg total) by mouth 3 (three) times daily as needed for dizziness. 07/26/18   Robinson, [place] N, PA-C  omeprazole (PRILOSEC) 40 MG capsule Take 40 mg by mouth daily.    [provider]  ondansetron (ZOFRAN ODT) 4 MG disintegrating tablet Take 1 tablet (4 mg total) by mouth every 8 (eight) hours as needed for nausea or vomiting. 07/26/18   Robinson, [place] N, PA-C  Potassium Chloride ER 20 MEQ TBCR Take 20 mEq by mouth 2 (two) times daily for 5 days. 07/26/18 07/31/18   Robinson, [place] N, PA-C    Family History No family history on file.  Social History Social History   Tobacco Use  . Smoking status: Never Smoker  . Smokeless tobacco: Never Used  Substance Use Topics  . Alcohol use: Never    Frequency: Never  . Drug use: Never     Allergies   Codeine, Tramadol, Vicodin [hydrocodone-acetaminophen], and Penicillins   Review of Systems Review of Systems  Constitutional: Negative for fever.  Eyes: Negative for visual disturbance.  Cardiovascular: Negative for chest pain.  Gastrointestinal: Positive for nausea and vomiting.  Musculoskeletal: Negative for neck pain.  Neurological: Positive for dizziness. Negative for headaches.  All other systems reviewed and are negative.    Physical Exam Updated Vital Signs BP 127/79   Pulse 84   Temp 98.3 F (36.8 C)   Resp 16   Ht 5' 4.5" (1.638 m)   Wt 63.5 kg   SpO2 100%   BMI 23.66 kg/m   Physical Exam Vitals signs and nursing note reviewed.  Constitutional:      General: She is not in acute distress.    Appearance: She is well-developed. She is not ill-appearing or diaphoretic.  HENT:     Head: Normocephalic and atraumatic.     Right Ear: External ear normal.     Left Ear: External ear normal.     Nose: Nose normal.  Eyes:     General:        Right eye: No discharge.        Left eye: No discharge.     Extraocular Movements: Extraocular movements intact.     Pupils: Pupils are equal, round, and reactive to light.  Cardiovascular:     Rate and Rhythm: Normal rate and regular rhythm.     Heart sounds: Normal heart sounds.  Pulmonary:     Effort: Pulmonary effort is normal.     Breath sounds: Normal breath sounds.  Abdominal:     Palpations: Abdomen is soft.     Tenderness: There is no abdominal tenderness.  Skin:    General: Skin is warm and dry.  Neurological:     Mental Status: She is alert.     Comments: CN 3-12 grossly intact. 5/5 strength in all 4 extremities. Grossly  normal sensation. Normal finger to nose.   Psychiatric:        Mood and Affect: Mood is not anxious.      ED Treatments / Results  Labs (all labs ordered are listed, but only abnormal results are displayed) Labs Reviewed  BASIC METABOLIC PANEL - Abnormal; Notable for the following components:      Result Value  Potassium 2.9 (*)    Glucose, Bld 107 (*)    Calcium 7.3 (*)    All other components within normal limits  CBC WITH DIFFERENTIAL/PLATELET - Abnormal; Notable for the following components:   WBC 11.1 (*)    Hemoglobin 11.9 (*)    Neutro Abs 8.8 (*)    All other components within normal limits  MAGNESIUM - Abnormal; Notable for the following components:   Magnesium 0.6 (*)    All other components within normal limits  URINALYSIS, ROUTINE W REFLEX MICROSCOPIC - Abnormal; Notable for the following components:   Ketones, ur 15 (*)    All other components within normal limits  SARS CORONAVIRUS 2 (HOSP ORDER, PERFORMED IN [facility] VIA [facility])  PHOSPHORUS  CBC WITH DIFFERENTIAL/PLATELET    EKG EKG Interpretation  Date/Time:  Thursday January 24 2019 17:47:27 EDT Ventricular Rate:  92 PR Interval:    QRS Duration: 127 QT Interval:  388 QTC Calculation: 480 R Axis:   23 Text Interpretation:  Sinus rhythm Nonspecific intraventricular conduction delay Borderline T abnormalities, anterior leads Confirmed by Sherwood Gambler [phone]) on 01/24/2019 5:58:12 PM   Radiology No results found.  Procedures .Critical Care Performed by: Sherwood Gambler, MD Authorized by: Sherwood Gambler, MD   Critical care provider statement:    Critical care time (minutes):  35   Critical care time was exclusive of:  Separately billable procedures and treating other patients   Critical care was necessary to treat or prevent imminent or life-threatening deterioration of the following conditions:  Metabolic crisis   Critical care was time spent personally by me on the following  activities:  Discussions with consultants, evaluation of patient's response to treatment, examination of patient, ordering and performing treatments and interventions, ordering and review of laboratory studies, ordering and review of radiographic studies, pulse oximetry, re-evaluation of patient's condition, obtaining history from patient or surrogate and review of old charts   (including critical care time)  Medications Ordered in ED Medications  potassium chloride 10 mEq in 100 mL IVPB (has no administration in time range)  magnesium sulfate IVPB 2 g 50 mL (has no administration in time range)  sodium chloride 0.9 % bolus 1,000 mL (0 mLs Intravenous Stopped 01/24/19 2041)  diazepam (VALIUM) injection 5 mg (5 mg Intravenous Given 01/24/19 1929)  potassium chloride SA (K-DUR) CR tablet 40 mEq (40 mEq Oral Given 01/24/19 2123)  potassium chloride 10 mEq in 100 mL IVPB (10 mEq Intravenous New Bag/Given 01/24/19 2123)  magnesium sulfate IVPB 2 g 50 mL (0 g Intravenous Stopped 01/24/19 2302)  meclizine (ANTIVERT) tablet 25 mg (25 mg Oral Given 01/24/19 2220)     Initial Impression / Assessment and Plan / ED Course  I have reviewed the triage vital signs and the nursing notes.  Pertinent labs & imaging results that were available during my care of the patient were reviewed by me and considered in my medical decision making (see chart for details).        Patient's dizziness has slightly improved.  No further vomiting.  Her electrolytes are significantly altered, especially the magnesium.  QTC is okay and she will be kept on telemetry.  She still a little bit dizzy, will try some Antivert.  I think this is a recurrent vertigo rather than an acute stroke as she has had this before.  Discussed with Dr. Marthenia Rolling, who advises phosphorus level and to give 3 more runs of IV potassium and 2 more grams of IV  magnesium in addition to what I have already given.  He accepts an admission.  Shannon Farrell was evaluated in  Emergency Department on 01/24/2019 for the symptoms described in the history of present illness. She was evaluated in the context of the global COVID-19 pandemic, which necessitated consideration that the patient might be at risk for infection with the SARS-CoV-2 virus that causes COVID-19. Institutional protocols and algorithms that pertain to the evaluation of patients at risk for COVID-19 are in a state of rapid change based on information released by regulatory bodies including the CDC and federal and state organizations. These policies and algorithms were followed during the patient's care in the ED.   Final Clinical Impressions(s) / ED Diagnoses   Final diagnoses:  Vertigo  Hypomagnesemia  Hypokalemia    ED Discharge Orders    None       Sherwood Gambler, MD 01/24/19 [phone]

## 2019-01-24 NOTE — ED Triage Notes (Signed)
Per EMS:  Pt driving home and had sudden onset of dizziness.  Pt vomited x 5 and called for EMS.  Pt given IV Zofran for nausea/ vomiting.    No c/o headaches.

## 2019-01-24 NOTE — ED Notes (Signed)
Pump malfunction pump replaced

## 2019-01-24 NOTE — ED Notes (Signed)
IV site clean dry intact and no signs of infiltration

## 2019-01-24 NOTE — ED Notes (Signed)
Pt on monitor 

## 2019-01-24 NOTE — ED Notes (Signed)
Pt. Reports she has had multiple episodes of vertigo and today she has had another one.  Pt. Reports she takes meclizine 4 times a day.

## 2019-01-24 NOTE — ED Notes (Signed)
Spoke with Bed placement and was informed that [facility] has no more telemetry beds. They did say that [facility] has medical telemetry which is what the patient requires. Spoke with EDP and he stated to contact [facility] and see if Hospitalist will accept to [facility]. NS called [facility] for RN

## 2019-01-25 ENCOUNTER — Observation Stay (HOSPITAL_COMMUNITY): Payer: Medicare HMO

## 2019-01-25 ENCOUNTER — Encounter (HOSPITAL_COMMUNITY): Payer: Self-pay | Admitting: Family Medicine

## 2019-01-25 DIAGNOSIS — E876 Hypokalemia: Secondary | ICD-10-CM

## 2019-01-25 DIAGNOSIS — R42 Dizziness and giddiness: Secondary | ICD-10-CM | POA: Diagnosis present

## 2019-01-25 DIAGNOSIS — Z20828 Contact with and (suspected) exposure to other viral communicable diseases: Secondary | ICD-10-CM | POA: Diagnosis not present

## 2019-01-25 DIAGNOSIS — Z7982 Long term (current) use of aspirin: Secondary | ICD-10-CM | POA: Diagnosis not present

## 2019-01-25 DIAGNOSIS — Z79899 Other long term (current) drug therapy: Secondary | ICD-10-CM | POA: Diagnosis not present

## 2019-01-25 DIAGNOSIS — E039 Hypothyroidism, unspecified: Secondary | ICD-10-CM | POA: Diagnosis not present

## 2019-01-25 DIAGNOSIS — J189 Pneumonia, unspecified organism: Secondary | ICD-10-CM | POA: Diagnosis not present

## 2019-01-25 DIAGNOSIS — Z853 Personal history of malignant neoplasm of breast: Secondary | ICD-10-CM | POA: Diagnosis not present

## 2019-01-25 LAB — BASIC METABOLIC PANEL
Anion gap: 13 (ref 5–15)
BUN: 7 mg/dL — ABNORMAL LOW (ref 8–23)
CO2: 22 mmol/L (ref 22–32)
Calcium: 8.4 mg/dL — ABNORMAL LOW (ref 8.9–10.3)
Chloride: 106 mmol/L (ref 98–111)
Creatinine, Ser: 0.75 mg/dL (ref 0.44–1.00)
GFR calc Af Amer: >60 mL/min (ref >60)
GFR calc non Af Amer: >60 mL/min (ref >60)
Glucose, Bld: 90 mg/dL (ref 70–99)
Potassium: 3.4 mmol/L — ABNORMAL LOW (ref 3.5–5.1)
Sodium: 141 mmol/L (ref 135–145)

## 2019-01-25 LAB — D-DIMER, QUANTITATIVE: D-Dimer, Quant: 1.36 ug/mL-FEU — ABNORMAL HIGH (ref 0.00–0.50)

## 2019-01-25 LAB — MAGNESIUM: Magnesium: 2 mg/dL (ref 1.7–2.4)

## 2019-01-25 LAB — TSH: TSH: 0.92 u[IU]/mL (ref 0.350–4.500)

## 2019-01-25 MED ORDER — POTASSIUM CHLORIDE IN NACL 20-0.9 MEQ/L-% IV SOLN
INTRAVENOUS | Status: DC
  Administered 2019-01-25: 04:00:00 via INTRAVENOUS
  Filled 2019-01-25: qty 1000

## 2019-01-25 MED ORDER — MECLIZINE HCL 12.5 MG PO TABS: 12.5000 mg | ORAL_TABLET | Freq: Three times a day (TID) | ORAL | Status: DC | PRN

## 2019-01-25 MED ORDER — POLYETHYLENE GLYCOL 3350 17 G PO PACK: 17.0000 g | PACK | Freq: Every day | ORAL | Status: DC | PRN

## 2019-01-25 MED ORDER — BOOST PO LIQD
237.0000 mL | Freq: Two times a day (BID) | ORAL | Status: DC
  Administered 2019-01-26: 237 mL via ORAL
  Filled 2019-01-25 (×3): qty 237

## 2019-01-25 MED ORDER — LEVOTHYROXINE SODIUM 75 MCG PO TABS
75.0000 ug | ORAL_TABLET | Freq: Every day | ORAL | Status: DC
  Administered 2019-01-25 – 2019-01-26 (×2): 75 ug via ORAL
  Filled 2019-01-25 (×2): qty 1

## 2019-01-25 MED ORDER — MECLIZINE HCL 12.5 MG PO TABS: 25.0000 mg | ORAL_TABLET | Freq: Three times a day (TID) | ORAL | Status: DC

## 2019-01-25 MED ORDER — MIRTAZAPINE 15 MG PO TABS
15.0000 mg | ORAL_TABLET | Freq: Every day | ORAL | Status: DC
  Administered 2019-01-25: 15 mg via ORAL
  Filled 2019-01-25: qty 1

## 2019-01-25 MED ORDER — ASPIRIN EC 81 MG PO TBEC
81.0000 mg | DELAYED_RELEASE_TABLET | Freq: Every day | ORAL | Status: DC
  Filled 2019-01-25 (×2): qty 1

## 2019-01-25 MED ORDER — DULOXETINE HCL 30 MG PO CPEP
30.0000 mg | ORAL_CAPSULE | Freq: Every day | ORAL | Status: DC
  Administered 2019-01-25 – 2019-01-26 (×2): 30 mg via ORAL
  Filled 2019-01-25 (×2): qty 1

## 2019-01-25 MED ORDER — POTASSIUM CHLORIDE IN NACL 20-0.9 MEQ/L-% IV SOLN: INTRAVENOUS | Status: DC

## 2019-01-25 MED ORDER — HYDROCODONE-ACETAMINOPHEN 5-325 MG PO TABS: 1.0000 | ORAL_TABLET | ORAL | Status: DC | PRN

## 2019-01-25 MED ORDER — DIAZEPAM 2 MG PO TABS: 2.0000 mg | ORAL_TABLET | Freq: Three times a day (TID) | ORAL | Status: DC | PRN

## 2019-01-25 MED ORDER — DICYCLOMINE HCL 10 MG PO CAPS
10.0000 mg | ORAL_CAPSULE | Freq: Three times a day (TID) | ORAL | Status: DC
  Administered 2019-01-25 – 2019-01-26 (×7): 10 mg via ORAL
  Filled 2019-01-25 (×9): qty 1

## 2019-01-25 MED ORDER — ONDANSETRON HCL 4 MG PO TABS: 4.0000 mg | ORAL_TABLET | Freq: Four times a day (QID) | ORAL | Status: DC | PRN

## 2019-01-25 MED ORDER — ACETAMINOPHEN 325 MG PO TABS: 650.0000 mg | ORAL_TABLET | Freq: Four times a day (QID) | ORAL | Status: DC | PRN

## 2019-01-25 MED ORDER — ONDANSETRON HCL 4 MG/2ML IJ SOLN: 4.0000 mg | Freq: Four times a day (QID) | INTRAMUSCULAR | Status: DC | PRN

## 2019-01-25 MED ORDER — PANTOPRAZOLE SODIUM 40 MG PO TBEC
40.0000 mg | DELAYED_RELEASE_TABLET | Freq: Every day | ORAL | Status: DC
  Administered 2019-01-25 – 2019-01-26 (×2): 40 mg via ORAL
  Filled 2019-01-25 (×2): qty 1

## 2019-01-25 MED ORDER — ENOXAPARIN SODIUM 40 MG/0.4ML ~~LOC~~ SOLN
40.0000 mg | Freq: Every day | SUBCUTANEOUS | Status: DC
  Administered 2019-01-25: 40 mg via SUBCUTANEOUS
  Filled 2019-01-25 (×2): qty 0.4

## 2019-01-25 MED ORDER — POTASSIUM CHLORIDE CRYS ER 20 MEQ PO TBCR
40.0000 meq | EXTENDED_RELEASE_TABLET | Freq: Once | ORAL | Status: AC
  Administered 2019-01-25: 40 meq via ORAL
  Filled 2019-01-25: qty 2

## 2019-01-25 MED ORDER — ACETAMINOPHEN 650 MG RE SUPP: 650.0000 mg | Freq: Four times a day (QID) | RECTAL | Status: DC | PRN

## 2019-01-25 MED ORDER — SODIUM CHLORIDE 0.9% FLUSH
3.0000 mL | Freq: Two times a day (BID) | INTRAVENOUS | Status: DC
  Administered 2019-01-25 – 2019-01-26 (×3): 3 mL via INTRAVENOUS

## 2019-01-25 NOTE — ED Notes (Signed)
[facility] was contacted about Bed order not being changed from [facility] to [facility]. [facility] stated they would page MD again.

## 2019-01-25 NOTE — ED Notes (Signed)
ED TO INPATIENT HANDOFF REPORT  ED Nurse Name and Phone #: Georgia Duff Name/Age/Gender Chuck Hint 68 y.o. female Room/Bed: MH10/MH10  Code Status   Code Status: Not on file  Home/SNF/Other Home Patient oriented to: self Is this baseline? Yes   Triage Complete: Triage complete  Chief Complaint Dizziness and Nausea  Triage Note Per EMS:  Pt driving home and had sudden onset of dizziness.  Pt vomited x 5 and called for EMS.  Pt given IV Zofran for nausea/ vomiting.    No c/o headaches.     Allergies Allergies  Allergen Reactions  . Codeine   . Tramadol   . Vicodin [Hydrocodone-Acetaminophen]   . Penicillins Diarrhea    DID THE REACTION INVOLVE: Swelling of the face/tongue/throat, SOB, or low BP? No nut causes diarrhea  Sudden or severe rash/hives, skin peeling, or the inside of the mouth or nose? N Did it require medical treatment? Y When did it last happen?November 2019. If all above answers are "NO", may proceed with cephalosporin use.     Level of Care/Admitting Diagnosis ED Disposition    ED Disposition Condition Comment   Admit  Hospital Area: [facility] [[id number]]  Level of Care: Telemetry Medical [104]  I expect the patient will be discharged within 24 hours: No (not a candidate for 5C-Observation unit)  Covid Evaluation: Asymptomatic Screening Protocol (No Symptoms)  Diagnosis: Vertigo [254982]  Admitting Physician: Bonnell Public [[id number]]  Attending Physician: Dana Allan I [[id number]]  PT Class (Do Not Modify): Observation [104]  PT Acc Code (Do Not Modify): Observation [10022]       B Medical/Surgery History Past Medical History:  Diagnosis Date  . Anxiety   . Cancer (HCC)    breast  . Irritable bowel syndrome   . Thyroid disease    Past Surgical History:  Procedure Laterality Date  . ABDOMINAL HYSTERECTOMY     partial  . BREAST LUMPECTOMY       A IV Location/Drains/Wounds Patient Lines/Drains/Airways Status    Active Line/Drains/Airways    Name:   Placement date:   Placement time:   Site:   Days:   Peripheral IV 01/24/19 Left Hand   01/24/19    -    Hand   1   External Urinary Catheter   01/25/19    0055    -   less than 1          Intake/Output Last 24 hours  Intake/Output Summary (Last 24 hours) at 01/25/2019 0101 Last data filed at 01/25/2019 0100 Gross per 24 hour  Intake 1102.27 ml  Output 425 ml  Net 677.27 ml    Labs/Imaging Results for orders placed or performed during the hospital encounter of 01/24/19 (from the past 48 hour(s))  CBC with Differential     Status: Abnormal   Collection Time: 01/24/19  8:30 PM  Result Value Ref Range   WBC 11.1 (H) 4.0 - 10.5 K/uL   RBC 4.38 3.87 - 5.11 MIL/uL   Hemoglobin 11.9 (L) 12.0 - 15.0 g/dL   HCT 37.2 36.0 - 46.0 %   MCV 84.9 80.0 - 100.0 fL   MCH 27.2 26.0 - 34.0 pg   MCHC 32.0 30.0 - 36.0 g/dL   RDW 14.1 11.5 - 15.5 %   Platelets 252 150 - 400 K/uL   nRBC 0.0 0.0 - 0.2 %   Neutrophils Relative % 79 %   Neutro Abs 8.8 (H) 1.7 - 7.7 K/uL  Lymphocytes Relative 15 %   Lymphs Abs 1.7 0.7 - 4.0 K/uL   Monocytes Relative 5 %   Monocytes Absolute 0.6 0.1 - 1.0 K/uL   Eosinophils Relative 0 %   Eosinophils Absolute 0.0 0.0 - 0.5 K/uL   Basophils Relative 1 %   Basophils Absolute 0.1 0.0 - 0.1 K/uL   Immature Granulocytes 0 %   Abs Immature Granulocytes 0.04 0.00 - 0.07 K/uL    Comment: Performed at [facility], [address] [place], [place] [address]  Phosphorus     Status: None   Collection Time: 01/24/19  8:30 PM  Result Value Ref Range   Phosphorus 3.2 2.5 - 4.6 mg/dL    Comment: Performed at [facility], [address] [place], [place] [address]  Basic metabolic panel     Status: Abnormal   Collection Time: 01/24/19  8:45 PM  Result Value Ref Range   Sodium 141 135 - 145 mmol/L   Potassium 2.9 (L) 3.5 - 5.1 mmol/L   Chloride 106 98 - 111 mmol/L   CO2 24 22 - 32 mmol/L   Glucose, Bld 107 (H)  70 - 99 mg/dL   BUN 12 8 - 23 mg/dL   Creatinine, Ser 0.62 0.44 - 1.00 mg/dL   Calcium 7.3 (L) 8.9 - 10.3 mg/dL   GFR calc non Af Amer >60 >60 mL/min   GFR calc Af Amer >60 >60 mL/min   Anion gap 11 5 - 15    Comment: Performed at [facility], [address] [place], [place] [address]  Magnesium     Status: Abnormal   Collection Time: 01/24/19  8:45 PM  Result Value Ref Range   Magnesium 0.6 (LL) 1.7 - 2.4 mg/dL    Comment: CRITICAL RESULT CALLED TO, READ BACK BY AND VERIFIED WITH: WALTON,M AT 2135 ON [id number] BY CHERESNOWSKY,T Performed at [facility], [address] [place], [place] [address]   SARS Coronavirus 2 (Hosp order,Performed in [facility] lab via [facility])     Status: None   Collection Time: 01/24/19 10:24 PM   Specimen: Nasal Swab  Result Value Ref Range   SARS Coronavirus 2 ([facility] Now) NEGATIVE NEGATIVE    Comment: (NOTE) SARS-CoV-2 target nucleic acids are NOT DETECTED. The SARS-CoV-2 RNA is generally detectable in upper and lower respiratory specimens during the acute phase of infection.  Negativeresults do not preclude SARS-CoV-2 infection, do not rule out coinfections with other pathogens, and should not be used as the  sole basis for treatment or other patient management decisions.  Negative results must be combined with clinical observations, patient history, and epidemiological information. The expected result is Negative. Fact Sheet for Patients: GolfingFamily.no Fact Sheet for Healthcare Providers: https://www.hernandez-brewer.com/ This test is not yet approved or cleared by the [place] FDA and  has been authorized for detection and/or diagnosis of SARS-CoV-2 by FDA under an Emergency Use Authorization (EUA).  This EUA will remain in effect (meaning this test can be used) for the duration of  the COVID19 declaration under Section 5 64(b)(1) of the Act, 21 U.S.C.  section [phone]  3(b)(1), unless the authorization is terminated or revoked sooner. Performed at [facility], [address] [place], [place] [address]   Urinalysis, Routine w reflex microscopic     Status: Abnormal   Collection Time: 01/24/19 11:08 PM  Result Value Ref Range   Color, Urine YELLOW  YELLOW   APPearance CLEAR CLEAR   Specific Gravity, Urine 1.020 1.005 - 1.030   pH 7.5 5.0 - 8.0   Glucose, UA NEGATIVE NEGATIVE mg/dL   Hgb urine dipstick NEGATIVE NEGATIVE   Bilirubin Urine NEGATIVE NEGATIVE   Ketones, ur 15 (A) NEGATIVE mg/dL   Protein, ur NEGATIVE NEGATIVE mg/dL   Nitrite NEGATIVE NEGATIVE   Leukocytes,Ua NEGATIVE NEGATIVE    Comment: Microscopic not done on urines with negative protein, blood, leukocytes, nitrite, or glucose < 500 mg/dL. Performed at [facility], [address] [place], [place] [address]    No results found.  Pending Labs Unresulted Labs (From admission, onward)    Start     Ordered   01/24/19 1831  CBC with Differential  Once,   STAT     01/24/19 1831   Signed and Held  HIV antibody (Routine Testing)  Once,   R     Signed and Held   Signed and Held  CBC  (enoxaparin (LOVENOX)    CrCl >/= 30 ml/min)  Once,   R    Comments: Baseline for enoxaparin therapy IF NOT ALREADY DRAWN.  Notify MD if PLT < 100 K.    Signed and Held   Signed and Held  Creatinine, serum  (enoxaparin (LOVENOX)    CrCl >/= 30 ml/min)  Once,   R    Comments: Baseline for enoxaparin therapy IF NOT ALREADY DRAWN.    Signed and Held   Signed and Held  Creatinine, serum  (enoxaparin (LOVENOX)    CrCl >/= 30 ml/min)  Weekly,   R    Comments: while on enoxaparin therapy    Signed and Held   Signed and Held  Phosphorus  Once,   R     Signed and Held   Signed and Held  TSH  Once,   R     Signed and Held   Signed and Held  Urinalysis, Complete w Microscopic  Once,   R     Signed and Held   Visual merchandiser and Occupational hygienist morning,   R     Signed and  Held   Visual merchandiser and Held  CBC  Tomorrow morning,   R     Signed and Held   Visual merchandiser and Held  Magnesium  Tomorrow morning,   R     Signed and Held   Signed and Held  Phosphorus  Tomorrow morning,   R     Signed and Held          Vitals/Pain Today's Vitals   01/24/19 2231 01/25/19 0000 01/25/19 0030 01/25/19 0037  BP: 127/79 121/81 126/72   Pulse: 84 83  86  Resp: 16 20  17   Temp:      TempSrc:      SpO2: 100% 92%  93%  Weight:      Height:      PainSc: 0-No pain       Isolation Precautions No active isolations  Medications Medications  potassium chloride 10 mEq in 100 mL IVPB (10 mEq Intravenous New Bag/Given 01/24/19 2355)  sodium chloride 0.9 % bolus 1,000 mL (0 mLs Intravenous Stopped 01/24/19 2041)  diazepam (VALIUM) injection 5 mg (5 mg Intravenous Given 01/24/19 1929)  potassium chloride SA (K-DUR) CR tablet 40 mEq (40 mEq Oral Given 01/24/19 2123)  potassium chloride 10 mEq in 100 mL IVPB (0 mEq Intravenous Stopped 01/24/19 2337)  magnesium sulfate IVPB 2 g  50 mL (0 g Intravenous Stopped 01/24/19 2302)  meclizine (ANTIVERT) tablet 25 mg (25 mg Oral Given 01/24/19 2220)  magnesium sulfate IVPB 2 g 50 mL (0 g Intravenous Stopped 01/25/19 0100)    Mobility walks Low fall risk   Focused Assessments Cardiac Assessment Handoff:  Cardiac Rhythm: Normal sinus rhythm Lab Results  Component Value Date   TROPONINI <0.03 08/13/2018   No results found for: DDIMER Does the Patient currently have chest pain? No     R Recommendations: See Admitting Provider Note  Report given to:   Additional Notes:

## 2019-01-25 NOTE — ED Notes (Signed)
Chris from [facility] was given report.

## 2019-01-25 NOTE — Progress Notes (Signed)
Brief note: Patient is a 68 year old female, presented to [facility] ER with repeated vertigo, found to have potassium of 2.9 and magnesium of 0.6.  Patient will be admitted for further assessment and management.  Abdominal electrolytes are being repleted.

## 2019-01-25 NOTE — H&P (Signed)
History and Physical    Tedra Coppernoll YIA:[id number] DOB: [birth year] DOA: 01/24/2019  PCP: Franne Forts, MD (Inactive)   Patient coming from: Home  Chief Complaint: Vertigo, N/V   HPI: Devine Dant is a 68 y.o. female with medical history significant for hypothyroidism and vertigo, now presenting to the emergency department for evaluation of dizziness and nausea with recurrent vomiting.  Patient reports that she developed her typical vertigo symptoms while driving yesterday, had nausea with recurrent episodes of vomiting, had to pull off to the side of the road for a while but was then eventually able to make it home.  She reports no relief from Antivert at home, went on to have many episodes of nonbloody vomiting without any abdominal pain or diarrhea, or dizziness became more and more severe, and she eventually called EMS.  She has not been having a headache with this.  She denies any chest pain.  She denies any fevers, chills, cough, or shortness of breath.  Symptoms are similar to her prior experiences with vertigo, but more severe and with much more vomiting.  She denies any focal numbness or weakness.  She was treated with Zofran by EMS prior to arrival in the ED.  [facility] ED Course: Upon arrival to the ED, patient is found to be afebrile, saturating well on room air, and with normal blood pressure and heart rate.  EKG features a sinus rhythm with nonspecific IVCD.  Chemistry panel is notable for potassium of 2.9 and magnesium of 0.6.  CBC features a mild leukocytosis.  Urinalysis notable for ketonuria.  Patient was treated with Antivert, Valium, 1 L of normal saline, 4 g IV magnesium, oral and IV potassium, and transfer to [facility] was arranged for further evaluation and management on the hospitalist service.  Review of Systems:  All other systems reviewed and apart from HPI, are negative.  Past Medical History:  Diagnosis Date  . Anxiety   . Cancer (HCC)    breast  . Irritable bowel syndrome   . Thyroid disease     Past Surgical History:  Procedure Laterality Date  . ABDOMINAL HYSTERECTOMY     partial  . BREAST LUMPECTOMY       reports that she has never smoked. She has never used smokeless tobacco. She reports that she does not drink alcohol or use drugs.  Allergies  Allergen Reactions  . Codeine   . Tramadol   . Vicodin [Hydrocodone-Acetaminophen]   . Penicillins Diarrhea    DID THE REACTION INVOLVE: Swelling of the face/tongue/throat, SOB, or low BP? No nut causes diarrhea  Sudden or severe rash/hives, skin peeling, or the inside of the mouth or nose? N Did it require medical treatment? Y When did it last happen?November 2019. If all above answers are "NO", may proceed with cephalosporin use.     History reviewed. No pertinent family history.   Prior to Admission medications   Medication Sig Start Date End Date Taking? Authorizing Provider  acetaminophen (TYLENOL) 500 MG tablet Take 500 mg by mouth every 6 (six) hours as needed for moderate pain.    [provider]  aspirin EC 81 MG tablet Take 81 mg by mouth daily.    [provider]  cetirizine (ZYRTEC ALLERGY) 10 MG tablet Take 10 mg by mouth daily.    [provider]  cetirizine (ZYRTEC) 10 MG tablet Take 10 mg by mouth daily.    [provider]  Cholecalciferol (VITAMIN D) 50 MCG (  2000 UT) tablet Take 4,000 Units by mouth daily.    [provider]  cyclobenzaprine (FLEXERIL) 10 MG tablet Take by mouth 3 (three) times daily as needed for muscle spasms.    [provider]  cyclobenzaprine (FLEXERIL) 10 MG tablet Take 1 tablet (10 mg total) by mouth 2 (two) times daily as needed for muscle spasms. Patient not taking: Reported on 07/26/2018 02/18/17   Mackuen, Courteney Lyn, MD  diazepam (VALIUM) 2 MG tablet Take 1 tablet (2 mg total) by mouth every 8 (eight) hours as needed (for vertigo; may cause drowsiness). 11/13/16    Molpus, John, MD  diclofenac (VOLTAREN) 50 MG EC tablet Take 50 mg by mouth 2 (two) times daily as needed for mild pain.     [provider]  dicyclomine (BENTYL) 10 MG capsule Take 10 mg by mouth 4 (four) times daily -  before meals and at bedtime.    [provider]  ibuprofen (ADVIL,MOTRIN) 800 MG tablet Take 1 tablet (800 mg total) by mouth 3 (three) times daily. Patient taking differently: Take 400 mg by mouth every 8 (eight) hours as needed for moderate pain.  02/18/17   Mackuen, Courteney Lyn, MD  levothyroxine (SYNTHROID, LEVOTHROID) 75 MCG tablet Take 75 mcg by mouth daily before breakfast.    [provider]  loperamide (IMODIUM) 2 MG capsule Take 1 capsule (2 mg total) by mouth 3 (three) times daily as needed for diarrhea or loose stools. 07/10/18   Caccavale, Sophia, PA-C  meclizine (ANTIVERT) 12.5 MG tablet Take 1 tablet (12.5 mg total) by mouth 3 (three) times daily as needed for dizziness. 07/26/18   Robinson, [place] N, PA-C  omeprazole (PRILOSEC) 40 MG capsule Take 40 mg by mouth daily.    [provider]  ondansetron (ZOFRAN ODT) 4 MG disintegrating tablet Take 1 tablet (4 mg total) by mouth every 8 (eight) hours as needed for nausea or vomiting. 07/26/18   Robinson, [place] N, PA-C  Potassium Chloride ER 20 MEQ TBCR Take 20 mEq by mouth 2 (two) times daily for 5 days. 07/26/18 07/31/18  Robinson, [place] N, PA-C    Physical Exam: Vitals:   01/25/19 0037 01/25/19 0100 01/25/19 0130 01/25/19 0239  BP:  121/76 135/80 133/80  Pulse: 86 78 81 79  Resp: 17 14 18 17   Temp:    97.7 F (36.5 C)  TempSrc:    Oral  SpO2: 93% 92% 93% 96%  Weight:      Height:        Constitutional: NAD, calm  Eyes: PERTLA, lids and conjunctivae normal ENMT: Mucous membranes are moist. Posterior pharynx clear of any exudate or lesions.   Neck: normal, supple, no masses, no thyromegaly Respiratory:no wheezing, no crackles. No accessory muscle use.  Cardiovascular: S1 & S2  heard, regular rate and rhythm. No extremity edema.   Abdomen: No distension, no tenderness, soft. Bowel sounds active.  Musculoskeletal: no clubbing / cyanosis. No joint deformity upper and lower extremities.    Skin: no significant rashes, lesions, ulcers. Warm, dry, well-perfused. Neurologic: CN 2-12 grossly intact. Sensation intact. Strength 5/5 in all 4 limbs.  Psychiatric: Alert and oriented x 3. Pleasant, cooperative.    Labs on Admission: I have personally reviewed following labs and imaging studies  CBC: Recent Labs  Lab 01/24/19 2030  WBC 11.1*  NEUTROABS 8.8*  HGB 11.9*  HCT 37.2  MCV 84.9  PLT 983   Basic Metabolic Panel: Recent Labs  Lab 01/24/19 2030 01/24/19 2045  NA  --  141  K  --  2.9*  CL  --  106  CO2  --  24  GLUCOSE  --  107*  BUN  --  12  CREATININE  --  0.62  CALCIUM  --  7.3*  MG  --  0.6*  PHOS 3.2  --    GFR: Estimated Creatinine Clearance: 61 mL/min (by C-G formula based on SCr of 0.62 mg/dL). Liver Function Tests: No results for input(s): AST, ALT, ALKPHOS, BILITOT, PROT, ALBUMIN in the last 168 hours. No results for input(s): LIPASE, AMYLASE in the last 168 hours. No results for input(s): AMMONIA in the last 168 hours. Coagulation Profile: No results for input(s): INR, PROTIME in the last 168 hours. Cardiac Enzymes: No results for input(s): CKTOTAL, CKMB, CKMBINDEX, TROPONINI in the last 168 hours. BNP (last 3 results) No results for input(s): PROBNP in the last 8760 hours. HbA1C: No results for input(s): HGBA1C in the last 72 hours. CBG: No results for input(s): GLUCAP in the last 168 hours. Lipid Profile: No results for input(s): CHOL, HDL, LDLCALC, TRIG, CHOLHDL, LDLDIRECT in the last 72 hours. Thyroid Function Tests: No results for input(s): TSH, T4TOTAL, FREET4, T3FREE, THYROIDAB in the last 72 hours. Anemia Panel: No results for input(s): VITAMINB12, FOLATE, FERRITIN, TIBC, IRON, RETICCTPCT in the last 72 hours. Urine  analysis:    Component Value Date/Time   COLORURINE YELLOW 01/24/2019 2308   APPEARANCEUR CLEAR 01/24/2019 2308   LABSPEC 1.020 01/24/2019 2308   PHURINE 7.5 01/24/2019 2308   GLUCOSEU NEGATIVE 01/24/2019 2308   HGBUR NEGATIVE 01/24/2019 2308   BILIRUBINUR NEGATIVE 01/24/2019 2308   KETONESUR 15 (A) 01/24/2019 2308   PROTEINUR NEGATIVE 01/24/2019 2308   NITRITE NEGATIVE 01/24/2019 2308   LEUKOCYTESUR NEGATIVE 01/24/2019 2308   Sepsis Labs: @LABRCNTIP (procalcitonin:4,lacticidven:4) ) Recent Results (from the past 240 hour(s))  SARS Coronavirus 2 (Hosp order,Performed in [place] lab via [employer] [facility])     Status: None   Collection Time: 01/24/19 10:24 PM   Specimen: Nasal Swab  Result Value Ref Range Status   SARS Coronavirus 2 ([employer] [facility] Now) NEGATIVE NEGATIVE Final    Comment: (NOTE) SARS-CoV-2 target nucleic acids are NOT DETECTED. The SARS-CoV-2 RNA is generally detectable in upper and lower respiratory specimens during the acute phase of infection.  Negativeresults do not preclude SARS-CoV-2 infection, do not rule out coinfections with other pathogens, and should not be used as the  sole basis for treatment or other patient management decisions.  Negative results must be combined with clinical observations, patient history, and epidemiological information. The expected result is Negative. Fact Sheet for Patients: GolfingFamily.no Fact Sheet for Healthcare Providers: https://www.hernandez-brewer.com/ This test is not yet approved or cleared by the [place] FDA and  has been authorized for detection and/or diagnosis of SARS-CoV-2 by FDA under an Emergency Use Authorization (EUA).  This EUA will remain in effect (meaning this test can be used) for the duration of  the COVID19 declaration under Section 5 64(b)(1) of the Act, 21 U.S.C.  section [phone] 3(b)(1), unless the authorization is terminated or revoked sooner. Performed at [facility], [address] [place], [place] [address]      Radiological Exams on Admission: No results found.  EKG: Independently reviewed. Sinus rhythm, rate 92, non-specific IVCD with QRS 122, QTc 480 ms.   Assessment/Plan   1. Vertigo  - Patient has history of vertigo and began to experience her usual symptoms on 7/2, but  it became more severe than any of her prior episodes, did not respond to Antivert, she has had numerous episodes of vomiting  - MRI brain pending, continue symptomatic treatments and supportive care   2. Hypokalemia, hypomagnesemia  - Serum potassium is 2.9 and magnesium 0.6 in ED  - Likely secondary to GI-losses after numerous episodes of vomiting  - Treated in ED with 4 g IV magnesium, 40 mEq oral potassium, and at least 10 mEq IV potassium  - Continue cardiac monitoring, continue antiemetics, repeat chemistries in am    3. Hypothyroidism  - Continue Synthroid    PPE: Mask, face shield  DVT prophylaxis: Lovenox  Code Status: Full  Family Communication: Discussed with patient  Consults called: None  Admission status: Observation     Vianne Bulls, MD [facility] Pager [phone]  If 7PM-7AM, please contact night-coverage [email or web address] Password [email or web address]  01/25/2019, 3:25 AM

## 2019-01-25 NOTE — Evaluation (Signed)
Physical Therapy Evaluation/Vestibular Evaluation Patient Details Name: Shannon Farrell MRN: [id number] DOB: Jul 11, 1951 Today's Date: 01/25/2019   History of Present Illness  Shannon Farrell is a 68 y.o. female with medical history significant for hypothyroidism, breast CA s/p lumpectomy and vertigo, admitted due to dizziness and nausea with recurrent vomiting.  Clinical Impression  Patient presents with continued dizziness and imbalance affecting safety and independence with ambulation and mobility.  Educated on visual targeting for decreased symptoms and improved balance.  Currently min A for mobility due to imbalance and will need assistance upon d/c.  Reports daughters can stay temporarily.  Will need continued skilled PT in the acute setting for stair training as has 19 steps to enter apartment.  Recommend initial follow up HHPT then referral from primary MD to outpatient vestibular rehab when able.      Follow Up Recommendations Home health PT;Supervision/Assistance - 24 hour    Equipment Recommendations  Rolling walker with 5" wheels;3in1 (PT)    Recommendations for Other Services       Precautions / Restrictions Precautions Precautions: Fall      Mobility  Bed Mobility Overal bed mobility: Needs Assistance Bed Mobility: Supine to Sit;Sit to Supine     Supine to sit: Min assist Sit to supine: Supervision;Min assist   General bed mobility comments: up and down for positional testing with assist for proper position, assist coming up due to dizziness/imbalance  Transfers Overall transfer level: Needs assistance Equipment used: None;Rolling walker (2 wheeled) Transfers: Sit to/from Stand Sit to Stand: Min assist         General transfer comment: up without device with significant balance loss posterior and min A to recover, with walker cues for hand placement, and assist again for balance  Ambulation/Gait Ambulation/Gait assistance: Min guard Gait Distance (Feet): 100  Feet Assistive device: Rolling walker (2 wheeled);1 person hand held assist Gait Pattern/deviations: Step-through pattern;Decreased stride length     General Gait Details: slow pace, cautious, dizzy, cues throughout to target stationary objects to reduce symptoms, improve stability  Stairs            Wheelchair Mobility    Modified Rankin (Stroke Patients Only)       Balance Overall balance assessment: Needs assistance   Sitting balance-Leahy Scale: Fair Sitting balance - Comments: once up and stabilized no LOB, but initially needed UE support   Standing balance support: Single extremity supported;Bilateral upper extremity supported Standing balance-Leahy Scale: Poor Standing balance comment: needed hand hold A and min A initially to get balanced, with walker able to stand with S                           Vestibular Assessment - 01/25/19 0001      Symptom Behavior   Subjective history of current problem  Patient reports about a 6 year history of vertigo that has been intermittent.  Reports did some form of vestibular rehab in [place] in the past, but lost her exercises.  Had been taking about 1 Meclizine then MD increased to 2 a day, then to 3.  Denies falls, but reports current bout is worst she has had and the N&V is new.  Usual symptom is wooziness, now it is spinning, feeling like passing out and nausea.    Type of Dizziness   Spinning;Imbalance;"World moves"    Frequency of Dizziness  intermittent    Duration of Dizziness  usual hours, now with movement (minutes)  Symptom Nature  Motion provoked;Intermittent    Aggravating Factors  Looking up to the ceiling;Moving eyes;Activity in general    Relieving Factors  Closing eyes;Rest;Slow movements    Progression of Symptoms  Better    History of similar episodes  See subjective      Oculomotor Exam   Oculomotor Alignment  Abnormal   L eye higher in orbit   Ocular ROM  WFL    Spontaneous  Absent     Gaze-induced   Absent    Smooth Pursuits  Intact    Saccades  Slow   difficulty with vertical due to dizziness moving eyes up     Vestibulo-Ocular Reflex   VOR 1 Head Only (x 1 viewing)  vertical and horizontal VOR intact, but with more symptoms with verticall    VOR to Slow Head Movement  Normal;Comment    VOR Cancellation  Normal    Comment  difficulty with head thrust test due to pt with stiffness in neck and aggravated with quick head movements closing eyes      Auditory   Comments  hearing intact to scratch test and equal bilateral      Positional Testing   Sidelying Test  Sidelying Right;Sidelying Left      Sidelying Right   Sidelying Right Duration  30 sec    Sidelying Right Symptoms  No nystagmus   but symptomatic to 9/10 in position and coming back up      Sidelying Left   Sidelying Left Duration  30 sec    Sidelying Left Symptoms  No nystagmus   but symptomatic in position and coming back up         Pertinent Vitals/Pain Pain Assessment: No/denies pain  Orthostatic VS for the past 24 hrs (Last 3 readings):  BP- Lying Pulse- Lying BP- Sitting Pulse- Sitting BP- Standing at 0 minutes Pulse- Standing at 0 minutes  01/25/19 1300 118/75 80 129/84 84 122/82 93      Home Living Family/patient expects to be discharged to:: Private residence Living Arrangements: Alone   Type of Home: Apartment Home Access: Stairs to enter Entrance Stairs-Rails: Psychiatric nurse of Steps: 19 Home Layout: One level Home Equipment: None      Prior Function Level of Independence: Independent               Hand Dominance        Extremity/Trunk Assessment        Lower Extremity Assessment Lower Extremity Assessment: Generalized weakness       Communication   Communication: No difficulties  Cognition Arousal/Alertness: Awake/alert Behavior During Therapy: WFL for tasks assessed/performed Overall Cognitive Status: Within Functional Limits for tasks  assessed                                        General Comments General comments (skin integrity, edema, etc.): Educated to get up to walk to bathroom with assist of nurses and okay if needs purewick at night.    Exercises     Assessment/Plan    PT Assessment Patient needs continued PT services  PT Problem List Decreased strength;Decreased knowledge of use of DME;Impaired sensation;Decreased mobility;Decreased activity tolerance;Decreased balance;Other (comment)(dizziness)       PT Treatment Interventions DME instruction;Stair training;Therapeutic activities;Balance training;Patient/family education;Therapeutic exercise;Functional mobility training;Gait training    PT Goals (Current goals can be found in the Care Plan section)  Acute Rehab PT Goals Patient Stated Goal: return to "normal" PT Goal Formulation: With patient Time For Goal Achievement: 02/01/19 Potential to Achieve Goals: Good    Frequency Min 3X/week   Barriers to discharge        Co-evaluation               AM-PAC PT "6 Clicks" Mobility  Outcome Measure Help needed turning from your back to your side while in a flat bed without using bedrails?: A Little Help needed moving from lying on your back to sitting on the side of a flat bed without using bedrails?: A Little Help needed moving to and from a bed to a chair (including a wheelchair)?: A Little Help needed standing up from a chair using your arms (e.g., wheelchair or bedside chair)?: A Little Help needed to walk in hospital room?: A Little Help needed climbing 3-5 steps with a railing? : A Lot 6 Click Score: 17    End of Session Equipment Utilized During Treatment: Gait belt Activity Tolerance: Patient limited by fatigue Patient left: in bed;with call bell/phone within reach   PT Visit Diagnosis: Dizziness and giddiness (R42);Other abnormalities of gait and mobility (R26.89)    Time: 1405-1500 PT Time Calculation (min)  (ACUTE ONLY): 55 min   Charges:   PT Evaluation $PT Eval Moderate Complexity: 1 Mod PT Treatments $Gait Training: 8-22 mins $Therapeutic Activity: 8-22 mins $Physical Performance Test: 8-22 mins        Magda Kiel, [place] [facility] [phone] 01/25/2019   Reginia Naas 01/25/2019, 4:53 PM

## 2019-01-25 NOTE — ED Notes (Signed)
Attempted to call report. RN given number and will call back

## 2019-01-25 NOTE — Progress Notes (Signed)
Patient admitted after midnight, please see H&P.  Awaiting repeat labs this AM to ensure proper replacement.  Also ordered vestibular PT Eval.  MRI negative  Home when the above is done  Eulogio Bear DO

## 2019-01-25 NOTE — Progress Notes (Signed)
Attempted to start a PIV with [place] x 1,unsuccessful, patient unable to tolerate IV stick, very  scared.Also asked if I can perform a midline,because  her veins are deep, I explained the procedure but  patient refused and said " I am so scared of IV's". Aniceto Boss, bedside RN aware and will notify the MD re: this matter.

## 2019-01-25 NOTE — ED Notes (Addendum)
Shannon Farrell [phone]  [name] [phone]

## 2019-01-26 ENCOUNTER — Encounter (HOSPITAL_COMMUNITY): Payer: Self-pay | Admitting: Radiology

## 2019-01-26 ENCOUNTER — Observation Stay (HOSPITAL_COMMUNITY): Payer: Medicare HMO

## 2019-01-26 DIAGNOSIS — R42 Dizziness and giddiness: Secondary | ICD-10-CM | POA: Diagnosis not present

## 2019-01-26 LAB — BASIC METABOLIC PANEL
Anion gap: 9 (ref 5–15)
BUN: 8 mg/dL (ref 8–23)
CO2: 23 mmol/L (ref 22–32)
Calcium: 8.4 mg/dL — ABNORMAL LOW (ref 8.9–10.3)
Chloride: 105 mmol/L (ref 98–111)
Creatinine, Ser: 0.67 mg/dL (ref 0.44–1.00)
GFR calc Af Amer: >60 mL/min (ref >60)
GFR calc non Af Amer: >60 mL/min (ref >60)
Glucose, Bld: 95 mg/dL (ref 70–99)
Potassium: 4 mmol/L (ref 3.5–5.1)
Sodium: 137 mmol/L (ref 135–145)

## 2019-01-26 LAB — HIV ANTIBODY (ROUTINE TESTING W REFLEX): HIV Screen 4th Generation wRfx: NONREACTIVE

## 2019-01-26 LAB — NOVEL CORONAVIRUS, NAA (HOSP ORDER, SEND-OUT TO REF LAB; TAT 18-24 HRS): SARS-CoV-2, NAA: NOT DETECTED

## 2019-01-26 LAB — MAGNESIUM: Magnesium: 1.8 mg/dL (ref 1.7–2.4)

## 2019-01-26 MED ORDER — CYCLOBENZAPRINE HCL 10 MG PO TABS: 5.0000 mg | ORAL_TABLET | Freq: Two times a day (BID) | ORAL | Status: DC | PRN

## 2019-01-26 MED ORDER — IOHEXOL 350 MG/ML SOLN
100.0000 mL | Freq: Once | INTRAVENOUS | Status: AC | PRN
  Administered 2019-01-26: 100 mL via INTRAVENOUS

## 2019-01-26 MED ORDER — DOXYCYCLINE HYCLATE 100 MG PO TABS
100.0000 mg | ORAL_TABLET | Freq: Two times a day (BID) | ORAL | Status: DC
  Administered 2019-01-26: 100 mg via ORAL
  Filled 2019-01-26: qty 1

## 2019-01-26 MED ORDER — DOXYCYCLINE HYCLATE 100 MG PO TABS: 100.0000 mg | ORAL_TABLET | Freq: Two times a day (BID) | ORAL | 0 refills | Status: DC

## 2019-01-26 NOTE — Progress Notes (Signed)
Physical Therapy Treatment Patient Details Name: Shannon Farrell MRN: [id number] DOB: 08/03/1950 Today's Date: 01/26/2019    History of Present Illness Shannon Farrell is a 68 y.o. female with medical history significant for hypothyroidism, breast CA s/p lumpectomy and vertigo, admitted due to dizziness and nausea with recurrent vomiting.    PT Comments    Pt admitted with above diagnosis. Pt currently with functional limitations due to the deficits listed below (see PT Problem List). Pt was able to ambulate with RW with min guard assist a good distance in hallway with steady gait.  Pt also practiced stairs and did well carrying RW up stairs.  Slow and steady on stairs.   Pt will benefit from skilled PT to increase their independence and safety with mobility to allow discharge to the venue listed below.     Follow Up Recommendations  Home health PT;Supervision/Assistance - 24 hour     Equipment Recommendations  Rolling walker with 5" wheels;3in1 (PT)    Recommendations for Other Services       Precautions / Restrictions Precautions Precautions: Fall Restrictions Weight Bearing Restrictions: No    Mobility  Bed Mobility Overal bed mobility: Needs Assistance Bed Mobility: Supine to Sit;Sit to Supine     Supine to sit: Min guard Sit to supine: Supervision;Min assist      Transfers Overall transfer level: Needs assistance Equipment used: Rolling walker (2 wheeled) Transfers: Sit to/from Stand Sit to Stand: Supervision            Ambulation/Gait Ambulation/Gait assistance: Min guard Gait Distance (Feet): 500 Feet Assistive device: Rolling walker (2 wheeled);1 person hand held assist Gait Pattern/deviations: Step-through pattern;Decreased stride length   Gait velocity interpretation: <1.31 ft/sec, indicative of household ambulator General Gait Details: Pt ambulated in hallway with good safety with RW.  Stopped by bathroom and was able to clean herself as well.      Stairs Stairs: Yes Stairs assistance: Min guard;Supervision Stair Management: One rail Left;Forwards;With walker;Step to pattern Number of Stairs: 10 General stair comments: Able to carry RW up stairs with her.    Wheelchair Mobility    Modified Rankin (Stroke Patients Only)       Balance Overall balance assessment: Needs assistance   Sitting balance-Leahy Scale: Fair     Standing balance support: Bilateral upper extremity supported;During functional activity Standing balance-Leahy Scale: Poor Standing balance comment: needed RW for  balance, with walker able to stand with S                            Cognition Arousal/Alertness: Awake/alert Behavior During Therapy: WFL for tasks assessed/performed Overall Cognitive Status: Within Functional Limits for tasks assessed                                        Exercises      General Comments        Pertinent Vitals/Pain Pain Assessment: No/denies pain    Home Living Family/patient expects to be discharged to:: Private residence Living Arrangements: Alone   Type of Home: Apartment Home Access: Stairs to enter Entrance Stairs-Rails: Right;Left Home Layout: One level Home Equipment: None      Prior Function Level of Independence: Independent          PT Goals (current goals can now be found in the care plan section) Acute Rehab PT Goals Patient Stated  Goal: return to "normal" Progress towards PT goals: Progressing toward goals    Frequency    Min 3X/week      PT Plan Current plan remains appropriate    Co-evaluation              AM-PAC PT "6 Clicks" Mobility   Outcome Measure  Help needed turning from your back to your side while in a flat bed without using bedrails?: None Help needed moving from lying on your back to sitting on the side of a flat bed without using bedrails?: None Help needed moving to and from a bed to a chair (including a wheelchair)?: A  Little Help needed standing up from a chair using your arms (e.g., wheelchair or bedside chair)?: A Little Help needed to walk in hospital room?: A Little Help needed climbing 3-5 steps with a railing? : A Little 6 Click Score: 20    End of Session Equipment Utilized During Treatment: Gait belt Activity Tolerance: Patient tolerated treatment well Patient left: in bed;with call bell/phone within reach Nurse Communication: Mobility status PT Visit Diagnosis: Dizziness and giddiness (R42);Other abnormalities of gait and mobility (R26.89)     Time: 7001-7494 PT Time Calculation (min) (ACUTE ONLY): 20 min  Charges:  $Gait Training: 8-22 mins                     [place] Pager:  [phone]  Office:  [place] 01/26/2019, 1:22 PM

## 2019-01-26 NOTE — Discharge Summary (Signed)
Physician Discharge Summary  Shannon Farrell JJH:[id number] DOB: Feb 11, 1951 DOA: 01/24/2019  PCP: Franne Forts, MD (Inactive)  Admit date: 01/24/2019 Discharge date: 01/26/2019  Admitted From: home Discharge disposition: home   Recommendations for Outpatient Follow-Up:   1. Home health with family supervision 2. No driving until symptoms resolved 3. BMP, MG in 1-2 weeks   Discharge Diagnosis:   Principal Problem:   Vertigo Active Problems:   Hypokalemia   Hypomagnesemia   Hypothyroidism pneumonia- community acquired   Discharge Condition: Improved.  Diet recommendation: Low sodium, heart healthy Wound care: None.  Code status: Full.   History of Present Illness:   Shannon Farrell is a 68 y.o. female with medical history significant for hypothyroidism and vertigo, now presenting to the emergency department for evaluation of dizziness and nausea with recurrent vomiting.  Patient reports that she developed her typical vertigo symptoms while driving yesterday, had nausea with recurrent episodes of vomiting, had to pull off to the side of the road for a while but was then eventually able to make it home.  She reports no relief from Antivert at home, went on to have many episodes of nonbloody vomiting without any abdominal pain or diarrhea, or dizziness became more and more severe, and she eventually called EMS.  She has not been having a headache with this.  She denies any chest pain.  She denies any fevers, chills, cough, or shortness of breath.  Symptoms are similar to her prior experiences with vertigo, but more severe and with much more vomiting.  She denies any focal numbness or weakness.  She was treated with Zofran by EMS prior to arrival in the ED.   Hospital Course by Problem:   1. Vertigo  - Patient has history of vertigo and began to experience her usual symptoms on 7/2, but it became more severe than any of her prior episodes, did not respond to Antivert, she has had  numerous episodes of vomiting  - MRI negative  2. Hypokalemia, hypomagnesemia  - Serum potassium is 2.9 and magnesium 0.6 in ED  - Likely secondary to GI-losses after numerous episodes of vomiting  - resolved   3. Hypothyroidism  - Continue Synthroid   4. CAP -short course of doxy   Medical Consultants:      Discharge Exam:   Vitals:   01/26/19 0800 01/26/19 1100  BP: 135/81 (!) 171/109  Pulse: 75 87  Resp: 18 18  Temp: 98 F (36.7 C) 98.3 F (36.8 C)  SpO2: 95% 95%   Vitals:   01/25/19 2347 01/26/19 0355 01/26/19 0800 01/26/19 1100  BP: 129/76 123/73 135/81 (!) 171/109  Pulse: 72 79 75 87  Resp: 18 18 18 18   Temp: 98 F (36.7 C) 98 F (36.7 C) 98 F (36.7 C) 98.3 F (36.8 C)  TempSrc: Oral Oral Oral Oral  SpO2: 93% 93% 95% 95%  Weight:      Height:        General exam: Appears calm and comfortable.  The results of significant diagnostics from this hospitalization (including imaging, microbiology, ancillary and laboratory) are listed below for reference.     Procedures and Diagnostic Studies:   Mr Brain Wo Contrast  Result Date: 01/25/2019 CLINICAL DATA:  68 y/o  F; dizziness, nausea, and vomiting. EXAM: MRI HEAD WITHOUT CONTRAST TECHNIQUE: Multiplanar, multiecho pulse sequences of the brain and surrounding structures were obtained without intravenous contrast. COMPARISON:  None. FINDINGS: Brain: No acute infarction, hemorrhage, hydrocephalus, extra-axial collection or  mass lesion. Punctate nonspecific T2 FLAIR hyperintensities in subcortical and periventricular white matter are compatible with mild chronic microvascular ischemic changes. Mild volume loss of the brain. Vascular: Normal flow voids. Skull and upper cervical spine: Normal marrow signal. Sinuses/Orbits: Negative. Other: None. IMPRESSION: 1. No acute intracranial abnormality. 2. Mild chronic microvascular ischemic changes and volume loss of the brain. Electronically Signed   By: Kristine Garbe M.D.   On: 01/25/2019 05:34     Labs:   Basic Metabolic Panel: Recent Labs  Lab 01/24/19 2030  01/24/19 2045 01/25/19 1427 01/26/19 0421  NA  --   --  141 141 137  K  --    < > 2.9* 3.4* 4.0  CL  --   --  106 106 105  CO2  --   --  24 22 23   GLUCOSE  --   --  107* 90 95  BUN  --   --  12 7* 8  CREATININE  --   --  0.62 0.75 0.67  CALCIUM  --   --  7.3* 8.4* 8.4*  MG  --   --  0.6* 2.0 1.8  PHOS 3.2  --   --   --   --    < > = values in this interval not displayed.   GFR Estimated Creatinine Clearance: 61 mL/min (by C-G formula based on SCr of 0.67 mg/dL). Liver Function Tests: No results for input(s): AST, ALT, ALKPHOS, BILITOT, PROT, ALBUMIN in the last 168 hours. No results for input(s): LIPASE, AMYLASE in the last 168 hours. No results for input(s): AMMONIA in the last 168 hours. Coagulation profile No results for input(s): INR, PROTIME in the last 168 hours.  CBC: Recent Labs  Lab 01/24/19 2030  WBC 11.1*  NEUTROABS 8.8*  HGB 11.9*  HCT 37.2  MCV 84.9  PLT 252   Cardiac Enzymes: No results for input(s): CKTOTAL, CKMB, CKMBINDEX, TROPONINI in the last 168 hours. BNP: Invalid input(s): POCBNP CBG: No results for input(s): GLUCAP in the last 168 hours. D-Dimer Recent Labs    01/25/19 1427  DDIMER 1.36*   Hgb A1c No results for input(s): HGBA1C in the last 72 hours. Lipid Profile No results for input(s): CHOL, HDL, LDLCALC, TRIG, CHOLHDL, LDLDIRECT in the last 72 hours. Thyroid function studies Recent Labs    01/25/19 1427  TSH 0.920   Anemia work up No results for input(s): VITAMINB12, FOLATE, FERRITIN, TIBC, IRON, RETICCTPCT in the last 72 hours. Microbiology Recent Results (from the past 240 hour(s))  SARS Coronavirus 2 (Hosp order,Performed in [facility] lab via [employer] [facility])     Status: None   Collection Time: 01/24/19 10:24 PM   Specimen: Nasal Swab  Result Value Ref Range Status   SARS Coronavirus 2 ([employer] [facility] Now)  NEGATIVE NEGATIVE Final    Comment: (NOTE) SARS-CoV-2 target nucleic acids are NOT DETECTED. The SARS-CoV-2 RNA is generally detectable in upper and lower respiratory specimens during the acute phase of infection.  Negativeresults do not preclude SARS-CoV-2 infection, do not rule out coinfections with other pathogens, and should not be used as the  sole basis for treatment or other patient management decisions.  Negative results must be combined with clinical observations, patient history, and epidemiological information. The expected result is Negative. Fact Sheet for Patients: GolfingFamily.no Fact Sheet for Healthcare Providers: https://www.hernandez-brewer.com/ This test is not yet approved or cleared by the [place] FDA and  has been authorized for detection and/or diagnosis of SARS-CoV-2  by FDA under an Emergency Use Authorization (EUA).  This EUA will remain in effect (meaning this test can be used) for the duration of  the COVID19 declaration under Section 5 64(b)(1) of the Act, 21 U.S.C.  section [phone] 3(b)(1), unless the authorization is terminated or revoked sooner. Performed at [facility], [address] [place], [place] [address]      Discharge Instructions:   Discharge Instructions    Diet - low sodium heart healthy   Complete by: As directed    Discharge instructions   Complete by: As directed    No driving until vertigo resolves Family to provide supervision   Increase activity slowly   Complete by: As directed      Allergies as of 01/26/2019      Reactions   Codeine Nausea And Vomiting   Anxiety   Tramadol Nausea And Vomiting   Anxiety   Vicodin [hydrocodone-acetaminophen] Nausea And Vomiting   Anxiety   Penicillins Diarrhea   DID THE REACTION INVOLVE: Swelling of the face/tongue/throat, SOB, or low BP? No nut causes diarrhea  Sudden or severe rash/hives, skin peeling, or the inside of the mouth or  nose? N Did it require medical treatment? Y When did it last happen?November 2019. If all above answers are "NO", may proceed with cephalosporin use.      Medication List    TAKE these medications   acetaminophen 500 MG tablet Commonly known as: TYLENOL Take 500 mg by mouth every 6 (six) hours as needed for moderate pain.   calcium carbonate 1250 (500 Ca) MG tablet Commonly known as: OS-CAL - dosed in mg of elemental calcium Take 1 tablet by mouth daily with breakfast.   cyclobenzaprine 10 MG tablet Commonly known as: FLEXERIL Take 0.5 tablets (5 mg total) by mouth 2 (two) times daily as needed for muscle spasms. What changed: how much to take   diazepam 2 MG tablet Commonly known as: VALIUM Take 1 tablet (2 mg total) by mouth every 8 (eight) hours as needed (for vertigo; may cause drowsiness).   diclofenac 50 MG EC tablet Commonly known as: VOLTAREN Take 50 mg by mouth 2 (two) times daily as needed for mild pain.   dicyclomine 10 MG capsule Commonly known as: BENTYL Take 10 mg by mouth 4 (four) times daily -  before meals and at bedtime.   doxycycline 100 MG tablet Commonly known as: VIBRA-TABS Take 1 tablet (100 mg total) by mouth every 12 (twelve) hours.   DULoxetine 30 MG capsule Commonly known as: CYMBALTA Take 30 mg by mouth daily.   lactose free nutrition Liqd Take 237 mLs by mouth 2 (two) times daily between meals.   levothyroxine 75 MCG tablet Commonly known as: SYNTHROID Take 75 mcg by mouth daily before breakfast.   loperamide 2 MG capsule Commonly known as: IMODIUM Take 1 capsule (2 mg total) by mouth 3 (three) times daily as needed for diarrhea or loose stools.   meclizine 12.5 MG tablet Commonly known as: ANTIVERT Take 1 tablet (12.5 mg total) by mouth 3 (three) times daily as needed for dizziness.   mirtazapine 15 MG tablet Commonly known as: REMERON Take 15 mg by mouth at bedtime.   multivitamin with minerals Tabs tablet Take 1 tablet by  mouth daily.   omeprazole 40 MG capsule Commonly known as: PRILOSEC Take 40 mg by mouth daily.   ondansetron 4 MG disintegrating tablet Commonly known as: Zofran ODT Take 1 tablet (4 mg total)  by mouth every 8 (eight) hours as needed for nausea or vomiting.   potassium chloride 10 MEQ tablet Commonly known as: K-DUR Take 10 mEq by mouth 2 (two) times daily.   Vitamin D 50 MCG (2000 UT) tablet Take 4,000 Units by mouth daily.   ZyrTEC Allergy 10 MG tablet Generic drug: cetirizine Take 10 mg by mouth daily.      Follow-up Information    Sapp, Amy, MD Follow up in 1 week(s).   Specialty: Family Medicine           Time coordinating discharge: 25 min  Signed:  Geradine Girt DO  [facility] 01/26/2019, 1:33 PM

## 2019-01-26 NOTE — TOC Transition Note (Signed)
Transition of Care [facility]) - CM/SW Discharge Note   Patient Details  Name: Shannon Farrell MRN: [id number] Date of Birth: [birth year]  Transition of Care [facility]) CM/SW Contact:  Claudie Leach, RN Phone Number: 01/26/2019, 5:43 PM     Final next level of care: Home w Home Health Services Barriers to Discharge: No Barriers Identified   Patient Goals and CMS Choice   CMS Medicare.gov Compare Post Acute Care list provided to:: Patient Choice offered to / list presented to : Patient   Discharge Plan and Services                DME Arranged: 3-N-1, [place] rolling DME Agency: [employer] Date DME Agency Contacted: 01/26/19 Time DME Agency Contacted: [id number] Representative spoke with at DME Agency: [place]: PT [place]: [place] Date [place]: 01/26/19 Time [place]: 1700 Representative spoke with at [place]: Tommi Rumps

## 2019-03-17 ENCOUNTER — Emergency Department (HOSPITAL_BASED_OUTPATIENT_CLINIC_OR_DEPARTMENT_OTHER): Payer: Medicare HMO

## 2019-03-17 ENCOUNTER — Encounter (HOSPITAL_BASED_OUTPATIENT_CLINIC_OR_DEPARTMENT_OTHER): Payer: Self-pay | Admitting: Emergency Medicine

## 2019-03-17 ENCOUNTER — Emergency Department (HOSPITAL_BASED_OUTPATIENT_CLINIC_OR_DEPARTMENT_OTHER)
Admission: EM | Admit: 2019-03-17 | Discharge: 2019-03-18 | Disposition: A | Discharge status: Home / Self Care | Payer: Medicare HMO | Attending: Emergency Medicine | Admitting: Emergency Medicine

## 2019-03-17 ENCOUNTER — Other Ambulatory Visit: Payer: Self-pay

## 2019-03-17 DIAGNOSIS — Z79899 Other long term (current) drug therapy: Secondary | ICD-10-CM | POA: Diagnosis not present

## 2019-03-17 DIAGNOSIS — Z853 Personal history of malignant neoplasm of breast: Secondary | ICD-10-CM | POA: Insufficient documentation

## 2019-03-17 DIAGNOSIS — R072 Precordial pain: Secondary | ICD-10-CM | POA: Diagnosis not present

## 2019-03-17 DIAGNOSIS — R079 Chest pain, unspecified: Secondary | ICD-10-CM | POA: Diagnosis present

## 2019-03-17 DIAGNOSIS — E039 Hypothyroidism, unspecified: Secondary | ICD-10-CM | POA: Insufficient documentation

## 2019-03-17 DIAGNOSIS — E876 Hypokalemia: Secondary | ICD-10-CM | POA: Diagnosis not present

## 2019-03-17 LAB — CBC
HCT: 37.7 % (ref 36.0–46.0)
Hemoglobin: 12.2 g/dL (ref 12.0–15.0)
MCH: 27.4 pg (ref 26.0–34.0)
MCHC: 32.4 g/dL (ref 30.0–36.0)
MCV: 84.5 fL (ref 80.0–100.0)
Platelets: 218 10*3/uL (ref 150–400)
RBC: 4.46 MIL/uL (ref 3.87–5.11)
RDW: 14.3 % (ref 11.5–15.5)
WBC: 7.7 10*3/uL (ref 4.0–10.5)
nRBC: 0 % (ref 0.0–0.2)

## 2019-03-17 LAB — MAGNESIUM: Magnesium: 0.7 mg/dL — CL (ref 1.7–2.4)

## 2019-03-17 LAB — BASIC METABOLIC PANEL
Anion gap: 14 (ref 5–15)
BUN: 11 mg/dL (ref 8–23)
CO2: 23 mmol/L (ref 22–32)
Calcium: 8 mg/dL — ABNORMAL LOW (ref 8.9–10.3)
Chloride: 103 mmol/L (ref 98–111)
Creatinine, Ser: 0.74 mg/dL (ref 0.44–1.00)
GFR calc Af Amer: >60 mL/min (ref >60)
GFR calc non Af Amer: >60 mL/min (ref >60)
Glucose, Bld: 120 mg/dL — ABNORMAL HIGH (ref 70–99)
Potassium: 2.7 mmol/L — CL (ref 3.5–5.1)
Sodium: 140 mmol/L (ref 135–145)

## 2019-03-17 LAB — TROPONIN I (HIGH SENSITIVITY): Troponin I (High Sensitivity): 2 ng/L (ref <18)

## 2019-03-17 MED ORDER — POTASSIUM CHLORIDE CRYS ER 20 MEQ PO TBCR
40.0000 meq | EXTENDED_RELEASE_TABLET | Freq: Once | ORAL | Status: AC
  Administered 2019-03-17: 40 meq via ORAL
  Filled 2019-03-17: qty 2

## 2019-03-17 MED ORDER — SODIUM CHLORIDE 0.9 % IV SOLN
INTRAVENOUS | Status: DC | PRN
  Administered 2019-03-17: 21:00:00 via INTRAVENOUS

## 2019-03-17 MED ORDER — MAGNESIUM SULFATE 2 GM/50ML IV SOLN
2.0000 g | Freq: Once | INTRAVENOUS | Status: AC
  Administered 2019-03-17: 2 g via INTRAVENOUS
  Filled 2019-03-17: qty 50

## 2019-03-17 MED ORDER — POTASSIUM CHLORIDE 10 MEQ/100ML IV SOLN
10.0000 meq | Freq: Once | INTRAVENOUS | Status: AC
  Administered 2019-03-17: 10 meq via INTRAVENOUS
  Filled 2019-03-17: qty 100

## 2019-03-17 NOTE — Discharge Instructions (Signed)
Continue take your potassium supplements at home.  Make an appointment follow-up with your doctor.  May very well need magnesium supplements as well.  Will need to have your magnesium and potassium rechecked in about a week.  Work-up for the chest pain without any acute findings.  Chest pain does not seem to be due to your heart.  Chest x-ray also negative no evidence of any lung abnormality.

## 2019-03-17 NOTE — ED Provider Notes (Signed)
[facility]Y DEPARTMENT Provider Note   CSN: [id number] Arrival date & time: 03/17/19  1813     History   Chief Complaint Chief Complaint  Patient presents with  . Chest Pain    HPI Shannon Farrell is a 68 y.o. female.     Patient with onset of left-sided chest pain on Tuesday.  Yesterday morning it moved into the substernal area.  Is a tightness ache feeling and has been there nonstop since that time.  Patient is known to have a history of low magnesium and low potassium.  No history of any coronary artery disease.  Patient had CT angios of the chest on July 2 without any acute findings.  Not associated with any shortness of breath not associated with any nausea or vomiting.  Patient denies any fevers or upper respiratory symptoms.     Past Medical History:  Diagnosis Date  . Anxiety   . Cancer (HCC)    breast  . Irritable bowel syndrome   . Thyroid disease     Patient Active Problem List   Diagnosis Date Noted  . Hypokalemia 01/25/2019  . Hypomagnesemia 01/25/2019  . Hypothyroidism 01/25/2019  . Vertigo 01/24/2019    Past Surgical History:  Procedure Laterality Date  . ABDOMINAL HYSTERECTOMY     partial  . BREAST LUMPECTOMY       OB History   No obstetric history on file.      Home Medications    Prior to Admission medications   Medication Sig Start Date End Date Taking? Authorizing Provider  acetaminophen (TYLENOL) 500 MG tablet Take 500 mg by mouth every 6 (six) hours as needed for moderate pain.   Yes [provider]  calcium carbonate (OS-CAL - DOSED IN MG OF ELEMENTAL CALCIUM) 1250 (500 Ca) MG tablet Take 1 tablet by mouth daily with breakfast.   Yes [provider]  cetirizine (ZYRTEC ALLERGY) 10 MG tablet Take 10 mg by mouth daily.   Yes [provider]  Cholecalciferol (VITAMIN D) 50 MCG (2000 UT) tablet Take 4,000 Units by mouth daily.   Yes [provider]  cyclobenzaprine (FLEXERIL) 10 MG  tablet Take 0.5 tablets (5 mg total) by mouth 2 (two) times daily as needed for muscle spasms. 01/26/19  Yes Vann, Jessica U, DO  diazepam (VALIUM) 2 MG tablet Take 1 tablet (2 mg total) by mouth every 8 (eight) hours as needed (for vertigo; may cause drowsiness). 11/13/16  Yes Molpus, John, MD  dicyclomine (BENTYL) 10 MG capsule Take 10 mg by mouth 4 (four) times daily -  before meals and at bedtime.   Yes [provider]  DULoxetine (CYMBALTA) 30 MG capsule Take 30 mg by mouth daily.   Yes [provider]  lactose free nutrition (BOOST) LIQD Take 237 mLs by mouth 2 (two) times daily between meals.   Yes [provider]  levothyroxine (SYNTHROID, LEVOTHROID) 75 MCG tablet Take 75 mcg by mouth daily before breakfast.   Yes [provider]  loperamide (IMODIUM) 2 MG capsule Take 1 capsule (2 mg total) by mouth 3 (three) times daily as needed for diarrhea or loose stools. 07/10/18  Yes Caccavale, Sophia, PA-C  meclizine (ANTIVERT) 12.5 MG tablet Take 1 tablet (12.5 mg total) by mouth 3 (three) times daily as needed for dizziness. 07/26/18  Yes Robinson, [place] N, PA-C  mirtazapine (REMERON) 15 MG tablet Take 15 mg by mouth at bedtime.   Yes [provider]  Multiple Vitamin (MULTIVITAMIN  WITH MINERALS) TABS tablet Take 1 tablet by mouth daily.   Yes [provider]  omeprazole (PRILOSEC) 40 MG capsule Take 40 mg by mouth daily.   Yes [provider]  potassium chloride (K-DUR) 10 MEQ tablet Take 10 mEq by mouth 2 (two) times daily.   Yes [provider]  diclofenac (VOLTAREN) 50 MG EC tablet Take 50 mg by mouth 2 (two) times daily as needed for mild pain.     [provider]  doxycycline (VIBRA-TABS) 100 MG tablet Take 1 tablet (100 mg total) by mouth every 12 (twelve) hours. 01/26/19   Geradine Girt, DO  ondansetron (ZOFRAN ODT) 4 MG disintegrating tablet Take 1 tablet (4 mg total) by mouth every 8 (eight) hours as needed for  nausea or vomiting. 07/26/18   Robinson, [place] N, PA-C    Family History No family history on file.  Social History Social History   Tobacco Use  . Smoking status: Never Smoker  . Smokeless tobacco: Never Used  Substance Use Topics  . Alcohol use: Never    Frequency: Never  . Drug use: Never     Allergies   Codeine, Tramadol, Vicodin [hydrocodone-acetaminophen], and Penicillins   Review of Systems Review of Systems  Constitutional: Negative for chills and fever.  HENT: Negative for congestion, rhinorrhea and sore throat.   Eyes: Negative for visual disturbance.  Respiratory: Negative for cough and shortness of breath.   Cardiovascular: Positive for chest pain. Negative for leg swelling.  Gastrointestinal: Negative for abdominal pain, diarrhea, nausea and vomiting.  Genitourinary: Negative for dysuria.  Musculoskeletal: Negative for back pain and neck pain.  Skin: Negative for rash.  Neurological: Negative for dizziness, light-headedness and headaches.  Hematological: Does not bruise/bleed easily.  Psychiatric/Behavioral: Negative for confusion.     Physical Exam Updated Vital Signs BP 124/81   Pulse 85   Temp 97.9 F (36.6 C) (Oral)   Resp 19   Ht 1.638 m (5' 4.5")   Wt 65.3 kg   SpO2 95%   BMI 24.34 kg/m   Physical Exam Vitals signs and nursing note reviewed.  Constitutional:      General: She is not in acute distress.    Appearance: She is well-developed.  HENT:     Head: Normocephalic and atraumatic.  Eyes:     Extraocular Movements: Extraocular movements intact.     Conjunctiva/sclera: Conjunctivae normal.     Pupils: Pupils are equal, round, and reactive to light.  Neck:     Musculoskeletal: Normal range of motion and neck supple.  Cardiovascular:     Rate and Rhythm: Normal rate and regular rhythm.     Heart sounds: No murmur.  Pulmonary:     Effort: Pulmonary effort is normal. No respiratory distress.     Breath sounds: Normal breath  sounds.  Chest:     Chest wall: No tenderness.  Abdominal:     Palpations: Abdomen is soft.     Tenderness: There is no abdominal tenderness.  Musculoskeletal: Normal range of motion.        General: No swelling.  Skin:    General: Skin is warm and dry.     Capillary Refill: Capillary refill takes less than 2 seconds.  Neurological:     General: No focal deficit present.     Mental Status: She is alert and oriented to person, place, and time.      ED Treatments / Results  Labs (all labs ordered are listed, but only  abnormal results are displayed) Labs Reviewed  BASIC METABOLIC PANEL - Abnormal; Notable for the following components:      Result Value   Potassium 2.7 (*)    Glucose, Bld 120 (*)    Calcium 8.0 (*)    All other components within normal limits  MAGNESIUM - Abnormal; Notable for the following components:   Magnesium 0.7 (*)    All other components within normal limits  CBC  TROPONIN I (HIGH SENSITIVITY)    EKG EKG Interpretation  Date/Time:  Sunday March 17 2019 18:23:55 EDT Ventricular Rate:  86 PR Interval:    QRS Duration: 88 QT Interval:  392 QTC Calculation: 469 R Axis:   44 Text Interpretation:  Sinus rhythm Probable anteroseptal infarct, old Interpretation limited secondary to artifact Otherwise no significant change Confirmed by Fredia Sorrow [phone]) on 03/17/2019 6:37:29 PM   Radiology Dg Chest Port 1 View  Result Date: 03/17/2019 CLINICAL DATA:  Chest pain EXAM: PORTABLE CHEST 1 VIEW COMPARISON:  08/13/2018 FINDINGS: Minimal streaky atelectasis at the left base and lingula. No consolidation or effusion. Normal heart size. No pneumothorax. IMPRESSION: No active disease. Minimal atelectasis at the lingula and left base. Electronically Signed   By: Donavan Foil M.D.   On: 03/17/2019 19:10    Procedures Procedures (including critical care time)  Medications Ordered in ED Medications  potassium chloride 10 mEq in 100 mL IVPB (has no  administration in time range)  0.9 %  sodium chloride infusion ( Intravenous New Bag/Given 03/17/19 2052)  potassium chloride SA (K-DUR) CR tablet 40 mEq (40 mEq Oral Given 03/17/19 2046)  magnesium sulfate IVPB 2 g 50 mL (2 g Intravenous New Bag/Given 03/17/19 2056)     Initial Impression / Assessment and Plan / ED Course  I have reviewed the triage vital signs and the nursing notes.  Pertinent labs & imaging results that were available during my care of the patient were reviewed by me and considered in my medical decision making (see chart for details).        EKG without any acute cardiac findings to be suggestive of STEMI.  Or significant ischemia.  Patient's first troponin negative chest x-ray negative for any acute findings.  The duration of the chest pain and the first troponin being without significant elevation delta is not required.  Since pain is been ongoing since Saturday morning.  Over 24 hours.  Patient with significant electrolyte abnormalities.  Significant hypokalemia hypomagnesia.  Patient received 40 mEq of potassium p.o.  Will receive another 40 mg a potassium prior to discharge.  Also received 10 mEq of potassium IV.  And patient received 2 g of mag sulfate IV.  Patient does have potassium pills at home.  Patient's renal function is normal.  Final Clinical Impressions(s) / ED Diagnoses   Final diagnoses:  Precordial pain  Hypokalemia  Hypomagnesemia    ED Discharge Orders    None       Fredia Sorrow, MD 03/17/19 2258

## 2019-03-17 NOTE — ED Triage Notes (Signed)
Reports pain in left breast since Tuesday.  Seen by PCP.  Was told it was muscle strain.  Now pain has moved to center of the chest and moves across the chest.  Describes as sharp in breast and pressure in the center.

## 2019-03-17 NOTE — ED Notes (Signed)
ED Provider at bedside. 

## 2019-03-17 NOTE — ED Notes (Signed)
NS increased to 200/hr during potassium infusion per VORB from Dr. Rogene Houston for pt comfort

## 2019-03-18 NOTE — ED Notes (Signed)
Pt ambulated to d/c window with steady gait. States she "feels much better"

## 2019-04-05 ENCOUNTER — Emergency Department (HOSPITAL_BASED_OUTPATIENT_CLINIC_OR_DEPARTMENT_OTHER): Payer: Medicare HMO

## 2019-04-05 ENCOUNTER — Encounter (HOSPITAL_BASED_OUTPATIENT_CLINIC_OR_DEPARTMENT_OTHER): Payer: Self-pay

## 2019-04-05 ENCOUNTER — Other Ambulatory Visit: Payer: Self-pay

## 2019-04-05 DIAGNOSIS — Z885 Allergy status to narcotic agent status: Secondary | ICD-10-CM | POA: Diagnosis not present

## 2019-04-05 DIAGNOSIS — Z79899 Other long term (current) drug therapy: Secondary | ICD-10-CM | POA: Diagnosis not present

## 2019-04-05 DIAGNOSIS — Z853 Personal history of malignant neoplasm of breast: Secondary | ICD-10-CM | POA: Insufficient documentation

## 2019-04-05 DIAGNOSIS — R0789 Other chest pain: Secondary | ICD-10-CM | POA: Insufficient documentation

## 2019-04-05 DIAGNOSIS — Z880 Allergy status to penicillin: Secondary | ICD-10-CM | POA: Diagnosis not present

## 2019-04-05 LAB — CBC
HCT: 36.3 % (ref 36.0–46.0)
Hemoglobin: 11.7 g/dL — ABNORMAL LOW (ref 12.0–15.0)
MCH: 27.5 pg (ref 26.0–34.0)
MCHC: 32.2 g/dL (ref 30.0–36.0)
MCV: 85.2 fL (ref 80.0–100.0)
Platelets: 248 10*3/uL (ref 150–400)
RBC: 4.26 MIL/uL (ref 3.87–5.11)
RDW: 14.3 % (ref 11.5–15.5)
WBC: 10.9 10*3/uL — ABNORMAL HIGH (ref 4.0–10.5)
nRBC: 0 % (ref 0.0–0.2)

## 2019-04-05 LAB — TROPONIN I (HIGH SENSITIVITY): Troponin I (High Sensitivity): 3 ng/L (ref <18)

## 2019-04-05 LAB — BASIC METABOLIC PANEL
Anion gap: 13 (ref 5–15)
BUN: 16 mg/dL (ref 8–23)
CO2: 23 mmol/L (ref 22–32)
Calcium: 9 mg/dL (ref 8.9–10.3)
Chloride: 105 mmol/L (ref 98–111)
Creatinine, Ser: 0.85 mg/dL (ref 0.44–1.00)
GFR calc Af Amer: >60 mL/min (ref >60)
GFR calc non Af Amer: >60 mL/min (ref >60)
Glucose, Bld: 120 mg/dL — ABNORMAL HIGH (ref 70–99)
Potassium: 3.5 mmol/L (ref 3.5–5.1)
Sodium: 141 mmol/L (ref 135–145)

## 2019-04-05 MED ORDER — SODIUM CHLORIDE 0.9% FLUSH
3.0000 mL | Freq: Once | INTRAVENOUS | Status: DC
  Filled 2019-04-05: qty 3

## 2019-04-05 NOTE — ED Triage Notes (Signed)
Pt c/o pain to right chest/breast x months-feels worse today-pt with constant conversation-NAD-steady gait

## 2019-04-06 ENCOUNTER — Emergency Department (HOSPITAL_BASED_OUTPATIENT_CLINIC_OR_DEPARTMENT_OTHER)
Admission: EM | Admit: 2019-04-06 | Discharge: 2019-04-06 | Disposition: A | Discharge status: Home / Self Care | Payer: Medicare HMO | Attending: Emergency Medicine | Admitting: Emergency Medicine

## 2019-04-06 DIAGNOSIS — R0789 Other chest pain: Secondary | ICD-10-CM

## 2019-04-06 LAB — MAGNESIUM: Magnesium: 1 mg/dL — ABNORMAL LOW (ref 1.7–2.4)

## 2019-04-06 LAB — TROPONIN I (HIGH SENSITIVITY): Troponin I (High Sensitivity): 3 ng/L (ref <18)

## 2019-04-06 MED ORDER — ALBUTEROL SULFATE HFA 108 (90 BASE) MCG/ACT IN AERS
2.0000 | INHALATION_SPRAY | RESPIRATORY_TRACT | Status: DC | PRN
  Administered 2019-04-06: 02:00:00 2 via RESPIRATORY_TRACT
  Filled 2019-04-06: qty 6.7

## 2019-04-06 MED ORDER — MAGNESIUM OXIDE 400 MG PO CAPS: ORAL_CAPSULE | ORAL | 0 refills | Status: DC

## 2019-04-06 MED ORDER — MAGNESIUM OXIDE 400 (241.3 MG) MG PO TABS
800.0000 mg | ORAL_TABLET | Freq: Once | ORAL | Status: AC
  Administered 2019-04-06: 03:00:00 800 mg via ORAL
  Filled 2019-04-06: qty 2

## 2019-04-06 MED ORDER — MAGNESIUM SULFATE 2 GM/50ML IV SOLN: 2.0000 g | Freq: Once | INTRAVENOUS | Status: DC

## 2019-04-06 NOTE — ED Provider Notes (Signed)
[facility] DEPT [facility] Provider Note: Georgena Spurling, MD, FACEP  CSN: [id number] MRN: [id number] ARRIVAL: 04/05/19 at 2203 ROOM: [place]  Chest Pain   HISTORY OF PRESENT ILLNESS  04/06/19 1:35 AM Shannon Farrell is a 68 y.o. female has been having sharp pains in her left breast for several months.  Yesterday evening about 8 PM she started having some pressure in her chest which she rates as a 9 out of 10.  It is located primarily right of the sternum and is worse with movement or palpation.  She had pneumonia in July and has had shortness of breath, worse with exertion off and on since.  She denies diaphoresis, nausea, vomiting, diarrhea, cough, fever or chills.    Past Medical History:  Diagnosis Date  . Anxiety   . Cancer (HCC)    breast  . Irritable bowel syndrome   . Thyroid disease     Past Surgical History:  Procedure Laterality Date  . ABDOMINAL HYSTERECTOMY     partial  . BREAST LUMPECTOMY      No family history on file.  Social History   Tobacco Use  . Smoking status: Never Smoker  . Smokeless tobacco: Never Used  Substance Use Topics  . Alcohol use: Never    Frequency: Never  . Drug use: Never    Prior to Admission medications   Medication Sig Start Date End Date Taking? Authorizing Provider  acetaminophen (TYLENOL) 500 MG tablet Take 500 mg by mouth every 6 (six) hours as needed for moderate pain.    [provider]  calcium carbonate (OS-CAL - DOSED IN MG OF ELEMENTAL CALCIUM) 1250 (500 Ca) MG tablet Take 1 tablet by mouth daily with breakfast.    [provider]  cetirizine (ZYRTEC ALLERGY) 10 MG tablet Take 10 mg by mouth daily.    [provider]  Cholecalciferol (VITAMIN D) 50 MCG (2000 UT) tablet Take 4,000 Units by mouth daily.    [provider]  cyclobenzaprine (FLEXERIL) 10 MG tablet Take 0.5 tablets (5 mg total) by mouth 2 (two) times daily as needed for muscle spasms. 01/26/19   Geradine Girt, DO  diazepam (VALIUM) 2 MG tablet Take 1 tablet (2 mg total) by mouth every 8 (eight) hours as needed (for vertigo; may cause drowsiness). 11/13/16   Klay Sobotka, MD  diclofenac (VOLTAREN) 50 MG EC tablet Take 50 mg by mouth 2 (two) times daily as needed for mild pain.     [provider]  dicyclomine (BENTYL) 10 MG capsule Take 10 mg by mouth 4 (four) times daily -  before meals and at bedtime.    [provider]  doxycycline (VIBRA-TABS) 100 MG tablet Take 1 tablet (100 mg total) by mouth every 12 (twelve) hours. 01/26/19   Geradine Girt, DO  DULoxetine (CYMBALTA) 30 MG capsule Take 30 mg by mouth daily.    [provider]  lactose free nutrition (BOOST) LIQD Take 237 mLs by mouth 2 (two) times daily between meals.    [provider]  levothyroxine (SYNTHROID, LEVOTHROID) 75 MCG tablet Take 75 mcg by mouth daily before breakfast.    [provider]  loperamide (IMODIUM) 2 MG capsule Take 1 capsule (2 mg total) by mouth 3 (three) times daily as needed for diarrhea or loose stools. 07/10/18   Caccavale, Sophia, PA-C  Magnesium Oxide 400 MG CAPS Take 1 capsule 4 times a day.  May cause diarrhea. 04/06/19  Lylla Eifler, MD  meclizine (ANTIVERT) 12.5 MG tablet Take 1 tablet (12.5 mg total) by mouth 3 (three) times daily as needed for dizziness. 07/26/18   Robinson, [place] N, PA-C  mirtazapine (REMERON) 15 MG tablet Take 15 mg by mouth at bedtime.    [provider]  Multiple Vitamin (MULTIVITAMIN WITH MINERALS) TABS tablet Take 1 tablet by mouth daily.    [provider]  omeprazole (PRILOSEC) 40 MG capsule Take 40 mg by mouth daily.    [provider]  ondansetron (ZOFRAN ODT) 4 MG disintegrating tablet Take 1 tablet (4 mg total) by mouth every 8 (eight) hours as needed for nausea or vomiting. 07/26/18   Robinson, [place] N, PA-C  potassium chloride (K-DUR) 10 MEQ tablet Take 10 mEq by mouth 2 (two) times daily.    [provider]    Allergies Codeine, Tramadol, Vicodin [hydrocodone-acetaminophen], and Penicillins   REVIEW OF SYSTEMS  Negative except as noted here or in the History of Present Illness.   PHYSICAL EXAMINATION  Initial Vital Signs Blood pressure 125/86, pulse 80, temperature 99.1 F (37.3 C), resp. rate 17, height 5\' 4"  (1.626 m), weight 64.9 kg, SpO2 99 %.  Examination General: Well-developed, well-nourished female in no acute distress; appearance consistent with age of record HENT: normocephalic; atraumatic Eyes: pupils equal, round and reactive to light; extraocular muscles intact Neck: supple Heart: regular rate and rhythm Lungs: Decreased air movement without frank wheezing Chest: Right parasternal tenderness Abdomen: soft; nondistended; nontender; bowel sounds present Extremities: No deformity; full range of motion; pulses normal Neurologic: Awake, alert and oriented; motor function intact in all extremities and symmetric; no facial droop Skin: Warm and dry Psychiatric: Circumstantial speech   RESULTS  Summary of this visit's results, reviewed by myself:   EKG Interpretation  Date/Time:  Friday April 05 2019 22:08:15 EDT Ventricular Rate:  93 PR Interval:  156 QRS Duration: 68 QT Interval:  348 QTC Calculation: 432 R Axis:   27 Text Interpretation:  Normal sinus rhythm No significant change was found Artifact Reconfirmed by Damaso Laday, Jenny Reichmann [phone]) on 04/06/2019 1:37:00 AM      Laboratory Studies: Results for orders placed or performed during the hospital encounter of 04/06/19 (from the past 24 hour(s))  Basic metabolic panel     Status: Abnormal   Collection Time: 04/05/19 10:52 PM  Result Value Ref Range   Sodium 141 135 - 145 mmol/L   Potassium 3.5 3.5 - 5.1 mmol/L   Chloride 105 98 - 111 mmol/L   CO2 23 22 - 32 mmol/L   Glucose, Bld 120 (H) 70 - 99 mg/dL   BUN 16 8 - 23 mg/dL   Creatinine, Ser 0.85 0.44 - 1.00 mg/dL   Calcium 9.0 8.9 - 10.3 mg/dL    GFR calc non Af Amer >60 >60 mL/min   GFR calc Af Amer >60 >60 mL/min   Anion gap 13 5 - 15  CBC     Status: Abnormal   Collection Time: 04/05/19 10:52 PM  Result Value Ref Range   WBC 10.9 (H) 4.0 - 10.5 K/uL   RBC 4.26 3.87 - 5.11 MIL/uL   Hemoglobin 11.7 (L) 12.0 - 15.0 g/dL   HCT 36.3 36.0 - 46.0 %   MCV 85.2 80.0 - 100.0 fL   MCH 27.5 26.0 - 34.0 pg   MCHC 32.2 30.0 - 36.0 g/dL   RDW 14.3 11.5 - 15.5 %   Platelets 248 150 - 400 K/uL   nRBC 0.0  0.0 - 0.2 %  Troponin I (High Sensitivity)     Status: None   Collection Time: 04/05/19 10:52 PM  Result Value Ref Range   Troponin I (High Sensitivity) 3 <18 ng/L  Troponin I (High Sensitivity)     Status: None   Collection Time: 04/06/19  1:45 AM  Result Value Ref Range   Troponin I (High Sensitivity) 3 <18 ng/L  Magnesium     Status: Abnormal   Collection Time: 04/06/19  1:45 AM  Result Value Ref Range   Magnesium 1.0 (L) 1.7 - 2.4 mg/dL   Imaging Studies: Dg Chest 2 View  Result Date: 04/05/2019 CLINICAL DATA:  Chest pain EXAM: CHEST - 2 VIEW COMPARISON:  03/17/2019 FINDINGS: The heart size and mediastinal contours are within normal limits. Both lungs are clear. The visualized skeletal structures are unremarkable. IMPRESSION: No active cardiopulmonary disease. Electronically Signed   By: Ulyses Jarred M.D.   On: 04/05/2019 22:57    ED COURSE and MDM  Nursing notes and initial vitals signs, including pulse oximetry, reviewed.  Vitals:   04/06/19 0155 04/06/19 0200 04/06/19 0230 04/06/19 0300  BP:  140/78 (!) 144/83 (!) 141/81  Pulse:  82 81 87  Resp:  18 (!) 26 (!) 31  Temp:      SpO2: 98% 96% 94% 94%  Weight:      Height:       2:51 AM Troponins are normal.  History is atypical for cardiac etiology.  Air movement improved after albuterol inhaler treatment.  We will send patient home with an inhaler and AeroChamber.  Magnesium is low and we will give 2 g of magnesium sulfate prior to discharge.  She was advised to  contact her PCP and advise him or her of her low potassium despite taking oral supplementation.  Nursing staff was unable to secure IV access.  She was given 400 mg of oral magnesium oxide and sent home with a prescription for 400 mg of magnesium oxide 4 times daily for 1 week.  She has an appointment with her physician on Tuesday (3 days from today) and was told to advise her physician of her recurrent low magnesium despite supplementation.  PROCEDURES    ED DIAGNOSES     ICD-10-CM   1. Atypical chest pain  R07.89   2. Hypomagnesemia  E83.42        Earlyn Sylvan, Jenny Reichmann, MD 04/06/19 [phone]

## 2019-04-06 NOTE — ED Notes (Signed)
ED Provider at bedside. 

## 2019-04-21 ENCOUNTER — Encounter (HOSPITAL_BASED_OUTPATIENT_CLINIC_OR_DEPARTMENT_OTHER): Payer: Self-pay | Admitting: Emergency Medicine

## 2019-04-21 ENCOUNTER — Other Ambulatory Visit: Payer: Self-pay

## 2019-04-21 ENCOUNTER — Emergency Department (HOSPITAL_BASED_OUTPATIENT_CLINIC_OR_DEPARTMENT_OTHER)
Admission: EM | Admit: 2019-04-21 | Discharge: 2019-04-22 | Disposition: A | Discharge status: Home / Self Care | Payer: Medicare HMO | Attending: Emergency Medicine | Admitting: Emergency Medicine

## 2019-04-21 ENCOUNTER — Emergency Department (HOSPITAL_BASED_OUTPATIENT_CLINIC_OR_DEPARTMENT_OTHER): Payer: Medicare HMO

## 2019-04-21 DIAGNOSIS — E039 Hypothyroidism, unspecified: Secondary | ICD-10-CM | POA: Diagnosis not present

## 2019-04-21 DIAGNOSIS — R0789 Other chest pain: Secondary | ICD-10-CM | POA: Diagnosis not present

## 2019-04-21 DIAGNOSIS — Z79899 Other long term (current) drug therapy: Secondary | ICD-10-CM | POA: Insufficient documentation

## 2019-04-21 DIAGNOSIS — Z853 Personal history of malignant neoplasm of breast: Secondary | ICD-10-CM | POA: Diagnosis not present

## 2019-04-21 LAB — CBC WITH DIFFERENTIAL/PLATELET
Abs Immature Granulocytes: 0.02 10*3/uL (ref 0.00–0.07)
Basophils Absolute: 0.1 10*3/uL (ref 0.0–0.1)
Basophils Relative: 1 %
Eosinophils Absolute: 0.1 10*3/uL (ref 0.0–0.5)
Eosinophils Relative: 1 %
HCT: 39 % (ref 36.0–46.0)
Hemoglobin: 12.6 g/dL (ref 12.0–15.0)
Immature Granulocytes: 0 %
Lymphocytes Relative: 37 %
Lymphs Abs: 3.1 10*3/uL (ref 0.7–4.0)
MCH: 27.2 pg (ref 26.0–34.0)
MCHC: 32.3 g/dL (ref 30.0–36.0)
MCV: 84.2 fL (ref 80.0–100.0)
Monocytes Absolute: 0.6 10*3/uL (ref 0.1–1.0)
Monocytes Relative: 7 %
Neutro Abs: 4.5 10*3/uL (ref 1.7–7.7)
Neutrophils Relative %: 54 %
Platelets: 257 10*3/uL (ref 150–400)
RBC: 4.63 MIL/uL (ref 3.87–5.11)
RDW: 14.3 % (ref 11.5–15.5)
WBC: 8.4 10*3/uL (ref 4.0–10.5)
nRBC: 0 % (ref 0.0–0.2)

## 2019-04-21 LAB — TROPONIN I (HIGH SENSITIVITY): Troponin I (High Sensitivity): <2 ng/L (ref <18)

## 2019-04-21 LAB — MAGNESIUM: Magnesium: 1.9 mg/dL (ref 1.7–2.4)

## 2019-04-21 LAB — BASIC METABOLIC PANEL
Anion gap: 13 (ref 5–15)
BUN: 13 mg/dL (ref 8–23)
CO2: 22 mmol/L (ref 22–32)
Calcium: 9.7 mg/dL (ref 8.9–10.3)
Chloride: 104 mmol/L (ref 98–111)
Creatinine, Ser: 0.9 mg/dL (ref 0.44–1.00)
GFR calc Af Amer: >60 mL/min (ref >60)
GFR calc non Af Amer: >60 mL/min (ref >60)
Glucose, Bld: 118 mg/dL — ABNORMAL HIGH (ref 70–99)
Potassium: 3.6 mmol/L (ref 3.5–5.1)
Sodium: 139 mmol/L (ref 135–145)

## 2019-04-21 MED ORDER — KETOROLAC TROMETHAMINE 30 MG/ML IJ SOLN
30.0000 mg | Freq: Once | INTRAMUSCULAR | Status: AC
  Administered 2019-04-21: 23:00:00 30 mg via INTRAMUSCULAR
  Filled 2019-04-21: qty 1

## 2019-04-21 MED ORDER — KETOROLAC TROMETHAMINE 15 MG/ML IJ SOLN
15.0000 mg | Freq: Once | INTRAMUSCULAR | Status: DC
  Filled 2019-04-21: qty 1

## 2019-04-21 NOTE — ED Triage Notes (Signed)
Reports having intermittent chest pain for months in center of the chest.  Describes as pressure in center with pain radiating to the left.  Using an inhaler with no relief.

## 2019-04-21 NOTE — ED Notes (Signed)
Attempted to start IV X 2 attempts to left arm without success.  Katie RN trying at this time.

## 2019-04-21 NOTE — ED Provider Notes (Addendum)
[name] Provider Note   CSN: [id number] Arrival date & time: 04/21/19  2120     History   Chief Complaint Chief Complaint  Patient presents with   Chest Pain    HPI Shannon Farrell is a 68 y.o. female.     HPI  68 year old female presents with chest pain.  Occurs multiple times per day and has been on and off since being seen here on 9/12.  She states that it is times is sharp and dull.  Seems to last for minutes at a time.  Some shortness of breath.  She states she was admitted for pneumonia back in July.  She was seen here on 9/12 and diagnosed with hypomagnesemia and has been taking her magnesium.  She recently had her omeprazole reduced and she is not sure if this is causing her symptoms.  She went back on omeprazole with no relief.  Nothing specific seems to make the chest pain occur.  No abdominal pain. Has tried the albuterol inhaler given with no relief.  Past Medical History:  Diagnosis Date   Anxiety    Cancer [facility])    breast   Irritable bowel syndrome    Thyroid disease     Patient Active Problem List   Diagnosis Date Noted   Hypokalemia 01/25/2019   Hypomagnesemia 01/25/2019   Hypothyroidism 01/25/2019   Vertigo 01/24/2019    Past Surgical History:  Procedure Laterality Date   ABDOMINAL HYSTERECTOMY     partial   BREAST LUMPECTOMY       OB History   No obstetric history on file.      Home Medications    Prior to Admission medications   Medication Sig Start Date End Date Taking? Authorizing Provider  acetaminophen (TYLENOL) 500 MG tablet Take 500 mg by mouth every 6 (six) hours as needed for moderate pain.   Yes [provider]  cetirizine (ZYRTEC ALLERGY) 10 MG tablet Take 10 mg by mouth daily.   Yes [provider]  Cholecalciferol (VITAMIN D) 50 MCG (2000 UT) tablet Take 4,000 Units by mouth daily.   Yes [provider]  diazepam (VALIUM) 2 MG tablet Take 1 tablet (2 mg  total) by mouth every 8 (eight) hours as needed (for vertigo; may cause drowsiness). 11/13/16  Yes Molpus, John, MD  dicyclomine (BENTYL) 10 MG capsule Take 10 mg by mouth 4 (four) times daily -  before meals and at bedtime.   Yes [provider]  DULoxetine (CYMBALTA) 30 MG capsule Take 30 mg by mouth daily.   Yes [provider]  levothyroxine (SYNTHROID, LEVOTHROID) 75 MCG tablet Take 75 mcg by mouth daily before breakfast.   Yes [provider]  loperamide (IMODIUM) 2 MG capsule Take 1 capsule (2 mg total) by mouth 3 (three) times daily as needed for diarrhea or loose stools. 07/10/18  Yes Caccavale, Sophia, PA-C  Magnesium Oxide 400 MG CAPS Take 1 capsule 4 times a day.  May cause diarrhea. 04/06/19  Yes Molpus, John, MD  meclizine (ANTIVERT) 12.5 MG tablet Take 1 tablet (12.5 mg total) by mouth 3 (three) times daily as needed for dizziness. 07/26/18  Yes Robinson, [place] N, PA-C  mirtazapine (REMERON) 15 MG tablet Take 15 mg by mouth at bedtime.   Yes [provider]  Multiple Vitamin (MULTIVITAMIN WITH MINERALS) TABS tablet Take 1 tablet by mouth daily.   Yes [provider]  potassium chloride (K-DUR) 10 MEQ tablet Take 10 mEq by  mouth 2 (two) times daily.   Yes [provider]  calcium carbonate (OS-CAL - DOSED IN MG OF ELEMENTAL CALCIUM) 1250 (500 Ca) MG tablet Take 1 tablet by mouth daily with breakfast.    [provider]  cyclobenzaprine (FLEXERIL) 10 MG tablet Take 0.5 tablets (5 mg total) by mouth 2 (two) times daily as needed for muscle spasms. 01/26/19   Geradine Girt, DO  diclofenac (VOLTAREN) 50 MG EC tablet Take 50 mg by mouth 2 (two) times daily as needed for mild pain.     [provider]  doxycycline (VIBRA-TABS) 100 MG tablet Take 1 tablet (100 mg total) by mouth every 12 (twelve) hours. 01/26/19   Geradine Girt, DO  lactose free nutrition (BOOST) LIQD Take 237 mLs by mouth 2 (two) times daily between meals.     [provider]  omeprazole (PRILOSEC) 40 MG capsule Take 40 mg by mouth daily.     [provider]  ondansetron (ZOFRAN ODT) 4 MG disintegrating tablet Take 1 tablet (4 mg total) by mouth every 8 (eight) hours as needed for nausea or vomiting. 07/26/18   Robinson, [place] N, PA-C    Family History No family history on file.  Social History Social History   Tobacco Use   Smoking status: Never Smoker   Smokeless tobacco: Never Used  Substance Use Topics   Alcohol use: Never    Frequency: Never   Drug use: Never     Allergies   Codeine, Tramadol, Vicodin [hydrocodone-acetaminophen], and Penicillins   Review of Systems Review of Systems  Constitutional: Negative for fever.  Respiratory: Positive for shortness of breath. Negative for cough.   Cardiovascular: Positive for chest pain.  Gastrointestinal: Negative for abdominal pain and vomiting.  All other systems reviewed and are negative.    Physical Exam Updated Vital Signs BP 128/82    Pulse 87    Temp 98.3 F (36.8 C) (Oral)    Resp 18    Ht 5\' 4"  (1.626 m)    Wt 64.9 kg    SpO2 97%    BMI 24.55 kg/m   Physical Exam Vitals signs and nursing note reviewed.  Constitutional:      General: She is not in acute distress.    Appearance: She is well-developed. She is not ill-appearing or diaphoretic.  HENT:     Head: Normocephalic and atraumatic.     Right Ear: External ear normal.     Left Ear: External ear normal.     Nose: Nose normal.  Eyes:     General:        Right eye: No discharge.        Left eye: No discharge.  Cardiovascular:     Rate and Rhythm: Normal rate and regular rhythm.     Heart sounds: Normal heart sounds.  Pulmonary:     Effort: Pulmonary effort is normal.     Breath sounds: Normal breath sounds.  Chest:     Chest wall: No tenderness.  Abdominal:     Palpations: Abdomen is soft.     Tenderness: There is no abdominal tenderness.  Skin:    General: Skin is warm and dry.    Neurological:     Mental Status: She is alert.  Psychiatric:        Mood and Affect: Mood is not anxious.      ED Treatments / Results  Labs (all labs ordered are listed, but only abnormal results are displayed) Labs  Reviewed  BASIC METABOLIC PANEL  CBC WITH DIFFERENTIAL/PLATELET  MAGNESIUM  TROPONIN I (HIGH SENSITIVITY)    EKG EKG Interpretation  Date/Time:  Sunday April 21 2019 21:46:24 EDT Ventricular Rate:  85 PR Interval:    QRS Duration: 88 QT Interval:  373 QTC Calculation: 444 R Axis:   28 Text Interpretation:  Sinus rhythm Consider left atrial enlargement Low voltage, precordial leads no significant change since Sept 11 2020 or 2018 Confirmed by Sherwood Gambler [phone]) on 04/21/2019 9:50:24 PM   Radiology No results found.  Procedures  Procedures (including critical care time)  Medications Ordered in ED Medications  ketorolac (TORADOL) 15 MG/ML injection 15 mg (has no administration in time range)     Initial Impression / Assessment and Plan / ED Course  I have reviewed the triage vital signs and the nursing notes.  Pertinent labs & imaging results that were available during my care of the patient were reviewed by me and considered in my medical decision making (see chart for details).        Patient's pain is quite atypical.  Given her age, troponins will be evaluated but my suspicion for ACS is pretty low.  Doubt PE or dissection.  Will check magnesium given the prior hypomagnesemia though she has been on supplementation.  I think given length of time of symptoms if 1st troponin is negative she would not need a 2nd. Otherwise, care transferred to Dr. Dina Rich.  Final Clinical Impressions(s) / ED Diagnoses   Final diagnoses:  None    ED Discharge Orders    None       Sherwood Gambler, MD 04/21/19 2352    Sherwood Gambler, MD 04/21/19 [phone]

## 2019-04-22 NOTE — Discharge Instructions (Addendum)
You were seen today for chest pain.  It is atypical in nature.  Continue your omeprazole.  Follow-up with your primary physician and cardiology for stress testing.

## 2019-04-22 NOTE — ED Provider Notes (Signed)
Patient signed out pending lab work.  In brief, presents with ongoing atypical chest pain.  Was seen and evaluated for this several weeks ago.  Patient reports that she was taken off her from omeprazole but started it back because she started having increased reflux symptoms.  On my evaluation she continues to endorse some anterior chest pain but thinks it is worse with laying flat.  Her cardiac work-up is negative including troponin.  Chest pain is atypical.  Will discharge home.  Encouraged her to continue omeprazole.  Follow-up with primary physician and cardiology for stress testing.  After history, exam, and medical workup I feel the patient has been appropriately medically screened and is safe for discharge home. Pertinent diagnoses were discussed with the patient. Patient was given return precautions.    Merryl Hacker, MD 04/22/19 [phone]

## 2019-05-08 ENCOUNTER — Ambulatory Visit: Payer: Self-pay | Admitting: *Deleted

## 2019-05-08 ENCOUNTER — Telehealth: Payer: Self-pay | Admitting: *Deleted

## 2019-05-08 NOTE — Telephone Encounter (Signed)
Pt seen at [facility] for CP, SOB on 04/06/2019. Given albuterol inhaler at that time, in ED.  Pt calling on community line requesting refill. Reports "I can feel the chest tightness coming on again and I am out of puffs." Did not see any documentation regarding inhaler. Advised pt to call PCP. If chest tightness, sob worsens, any CP, dizziness, advised ED. Pt verbalizes understanding.

## 2019-05-20 ENCOUNTER — Telehealth (HOSPITAL_COMMUNITY): Payer: Self-pay | Admitting: *Deleted

## 2019-05-20 ENCOUNTER — Encounter: Payer: Self-pay | Admitting: Cardiology

## 2019-05-20 ENCOUNTER — Other Ambulatory Visit: Payer: Self-pay

## 2019-05-20 ENCOUNTER — Ambulatory Visit (INDEPENDENT_AMBULATORY_CARE_PROVIDER_SITE_OTHER): Payer: Medicare HMO | Admitting: Cardiology

## 2019-05-20 DIAGNOSIS — R079 Chest pain, unspecified: Secondary | ICD-10-CM | POA: Diagnosis not present

## 2019-05-20 DIAGNOSIS — R0789 Other chest pain: Secondary | ICD-10-CM | POA: Insufficient documentation

## 2019-05-20 NOTE — Addendum Note (Signed)
Addended by: Beckey Rutter on: 05/20/2019 11:17 AM   Modules accepted: Orders

## 2019-05-20 NOTE — Addendum Note (Signed)
Addended by: Beckey Rutter on: 05/20/2019 05:14 PM   Modules accepted: Orders

## 2019-05-20 NOTE — Patient Instructions (Signed)
Medication Instructions:  Your physician recommends that you continue on your current medications as directed. Please refer to the Current Medication list given to you today.  If you need a refill on your cardiac medications before your next appointment, please call your pharmacy.   Lab work: NONE If you have labs (blood work) drawn today and your tests are completely normal, you will receive your results only by: . MyChart Message (if you have MyChart) OR . A paper copy in the mail If you have any lab test that is abnormal or we need to change your treatment, we will call you to review the results.  Testing/Procedures: You had an EKG performed today  Your physician has requested that you have a lexiscan myoview. For further information please visit [email or web address]. Please follow instruction sheet, as given.    Follow-Up: At [facility], you and your health needs are our priority.  As part of our continuing mission to provide you with exceptional heart care, we have created designated Provider Care Teams.  These Care Teams include your primary Cardiologist (physician) and Advanced Practice Providers (APPs -  Physician Assistants and Nurse Practitioners) who all work together to provide you with the care you need, when you need it. You will need a follow up appointment in 6 months.   Any Other Special Instructions Will Be Listed Below  Regadenoson injection What is this medicine? REGADENOSON is used to test the heart for coronary artery disease. It is used in patients who can not exercise for their stress test. This medicine may be used for other purposes; ask your health care provider or pharmacist if you have questions. COMMON BRAND NAME(S): Lexiscan What should I tell my health care provider before I take this medicine? They need to know if you have any of these conditions:  heart problems  lung or breathing disease, like asthma or COPD  an unusual or allergic reaction to  regadenoson, other medicines, foods, dyes, or preservatives  pregnant or trying to get pregnant  breast-feeding How should I use this medicine? This medicine is for injection into a vein. It is given by a health care professional in a hospital or clinic setting. Talk to your pediatrician regarding the use of this medicine in children. Special care may be needed. Overdosage: If you think you have taken too much of this medicine contact a poison control center or emergency room at once. NOTE: This medicine is only for you. Do not share this medicine with others. What if I miss a dose? This does not apply. What may interact with this medicine?  caffeine  dipyridamole  guarana  theophylline This list may not describe all possible interactions. Give your health care provider a list of all the medicines, herbs, non-prescription drugs, or dietary supplements you use. Also tell them if you smoke, drink alcohol, or use illegal drugs. Some items may interact with your medicine. What should I watch for while using this medicine? Your condition will be monitored carefully while you are receiving this medicine. Do not take medicines, foods, or drinks with caffeine (like coffee, tea, or colas) for at least 12 hours before your test. If you do not know if something contains caffeine, ask your health care professional. What side effects may I notice from receiving this medicine? Side effects that you should report to your doctor or health care professional as soon as possible:  allergic reactions like skin rash, itching or hives, swelling of the face, lips, or tongue  breathing   problems  chest pain, tightness or palpitations  severe headache Side effects that usually do not require medical attention (report to your doctor or health care professional if they continue or are bothersome):  flushing  headache  irritation or pain at site where injected  nausea, vomiting This list may not  describe all possible side effects. Call your doctor for medical advice about side effects. You may report side effects to FDA at 1-800-FDA-1088. Where should I keep my medicine? This drug is given in a hospital or clinic and will not be stored at home. NOTE: This sheet is a summary. It may not cover all possible information. If you have questions about this medicine, talk to your doctor, pharmacist, or health care provider.  2020 Elsevier/Gold Standard (2008-03-10 15:08:13)   Cardiac Nuclear Scan A cardiac nuclear scan is a test that is done to check the flow of blood to your heart. It is done when you are resting and when you are exercising. The test looks for problems such as:  Not enough blood reaching a portion of the heart.  The heart muscle not working as it should. You may need this test if:  You have heart disease.  You have had lab results that are not normal.  You have had heart surgery or a balloon procedure to open up blocked arteries (angioplasty).  You have chest pain.  You have shortness of breath. In this test, a special dye (tracer) is put into your bloodstream. The tracer will travel to your heart. A camera will then take pictures of your heart to see how the tracer moves through your heart. This test is usually done at a hospital and takes 2-4 hours. Tell a doctor about:  Any allergies you have.  All medicines you are taking, including vitamins, herbs, eye drops, creams, and over-the-counter medicines.  Any problems you or family members have had with anesthetic medicines.  Any blood disorders you have.  Any surgeries you have had.  Any medical conditions you have.  Whether you are pregnant or may be pregnant. What are the risks? Generally, this is a safe test. However, problems may occur, such as:  Serious chest pain and heart attack. This is only a risk if the stress portion of the test is done.  Rapid heartbeat.  A feeling of warmth in your  chest. This feeling usually does not last long.  Allergic reaction to the tracer. What happens before the test?  Ask your doctor about changing or stopping your normal medicines. This is important.  Follow instructions from your doctor about what you cannot eat or drink.  Remove your jewelry on the day of the test. What happens during the test?  An IV tube will be inserted into one of your veins.  Your doctor will give you a small amount of tracer through the IV tube.  You will wait for 20-40 minutes while the tracer moves through your bloodstream.  Your heart will be monitored with an electrocardiogram (ECG).  You will lie down on an exam table.  Pictures of your heart will be taken for about 15-20 minutes.  You may also have a stress test. For this test, one of these things may be done: ? You will be asked to exercise on a treadmill or a stationary bike. ? You will be given medicines that will make your heart work harder. This is done if you are unable to exercise.  When blood flow to your heart has peaked, a   tracer will again be given through the IV tube.  After 20-40 minutes, you will get back on the exam table. More pictures will be taken of your heart.  Depending on the tracer that is used, more pictures may need to be taken 3-4 hours later.  Your IV tube will be removed when the test is over. The test may vary among doctors and hospitals. What happens after the test?  Ask your doctor: ? Whether you can return to your normal schedule, including diet, activities, and medicines. ? Whether you should drink more fluids. This will help to remove the tracer from your body. Drink enough fluid to keep your pee (urine) pale yellow.  Ask your doctor, or the department that is doing the test: ? When will my results be ready? ? How will I get my results? Summary  A cardiac nuclear scan is a test that is done to check the flow of blood to your heart.  Tell your doctor  whether you are pregnant or may be pregnant.  Before the test, ask your doctor about changing or stopping your normal medicines. This is important.  Ask your doctor whether you can return to your normal activities. You may be asked to drink more fluids. This information is not intended to replace advice given to you by your health care provider. Make sure you discuss any questions you have with your health care provider. Document Released: 12/25/2017 Document Revised: 10/31/2018 Document Reviewed: 12/25/2017 Elsevier Patient Education  2020 Elsevier Inc.  

## 2019-05-20 NOTE — Telephone Encounter (Signed)
Attempted to call patient regarding upcoming appointment- no answer, unable to leave a message.  Shannon Farrell  

## 2019-05-20 NOTE — Progress Notes (Signed)
Cardiology Office Note:    Date:  05/20/2019   ID:  Shannon Farrell, DOB 09-12-1950, MRN [id number]  PCP:  Franne Forts, MD (Inactive)  Cardiologist:  Jenean Lindau, MD   Referring MD: Sherwood Gambler, MD    ASSESSMENT:    1. Chest discomfort    PLAN:    In order of problems listed above:  1. Chest discomfort: Patient symptoms are atypical for coronary etiology however in view of her anxiety and risk factors she will undergo Lexiscan sestamibi.  I discussed this test with her at extensive length and questions were answered to her satisfaction.  Primary prevention stressed to the patient.  She knows to go to the nearest emergency room for any concerning symptoms. 2. Patient will be seen in follow-up appointment in 6 months or earlier if the patient has any concerns    Medication Adjustments/Labs and Tests Ordered: Current medicines are reviewed at length with the patient today.  Concerns regarding medicines are outlined above.  No orders of the defined types were placed in this encounter.  No orders of the defined types were placed in this encounter.    History of Present Illness:    Shannon Farrell is a 68 y.o. female who is being seen today for the evaluation of chest discomfort at the request of Sherwood Gambler, MD.  It is a pleasant 68 year old female.  She has no significant past medical history.  She runs low magnesium and therefore takes supplements.  She is being referred here because of chest discomfort.  Patient mentions to me that occasionally she will have chest discomfort and this does not radiate to any part of the body.  No orthopnea or PND.  Is awake sensation and she has not a great historian.  She mentions to me that exertion does not require and bring about the symptoms.  At the time of my evaluation, the patient is alert awake oriented and in no distress.  Past Medical History:  Diagnosis Date  . Anxiety   . Cancer (HCC)    breast  . Irritable bowel syndrome    . Thyroid disease     Past Surgical History:  Procedure Laterality Date  . ABDOMINAL HYSTERECTOMY     partial  . BREAST LUMPECTOMY      Current Medications: Current Meds  Medication Sig  . acetaminophen (TYLENOL) 500 MG tablet Take 500 mg by mouth every 6 (six) hours as needed for moderate pain.  Marland Kitchen albuterol (VENTOLIN HFA) 108 (90 Base) MCG/ACT inhaler   . calcium carbonate (OS-CAL - DOSED IN MG OF ELEMENTAL CALCIUM) 1250 (500 Ca) MG tablet Take 1 tablet by mouth daily with breakfast.  . cetirizine (ZYRTEC ALLERGY) 10 MG tablet Take 10 mg by mouth daily.  . Cholecalciferol (VITAMIN D) 50 MCG (2000 UT) tablet Take 4,000 Units by mouth daily.  . cyclobenzaprine (FLEXERIL) 10 MG tablet Take 0.5 tablets (5 mg total) by mouth 2 (two) times daily as needed for muscle spasms.  . diazepam (VALIUM) 2 MG tablet Take 1 tablet (2 mg total) by mouth every 8 (eight) hours as needed (for vertigo; may cause drowsiness).  Marland Kitchen diclofenac (VOLTAREN) 50 MG EC tablet Take 50 mg by mouth 2 (two) times daily as needed for mild pain.   Marland Kitchen dicyclomine (BENTYL) 10 MG capsule Take 10 mg by mouth 4 (four) times daily -  before meals and at bedtime.  Marland Kitchen doxycycline (VIBRA-TABS) 100 MG tablet Take 1 tablet (100 mg total) by mouth every 12 (  twelve) hours.  . DULoxetine (CYMBALTA) 30 MG capsule Take 30 mg by mouth daily.  . fluticasone (FLONASE) 50 MCG/ACT nasal spray   . lactose free nutrition (BOOST) LIQD Take 237 mLs by mouth 2 (two) times daily between meals.  Marland Kitchen levocetirizine (XYZAL) 5 MG tablet   . levothyroxine (SYNTHROID, LEVOTHROID) 75 MCG tablet Take 75 mcg by mouth daily before breakfast.  . loperamide (IMODIUM) 2 MG capsule Take 1 capsule (2 mg total) by mouth 3 (three) times daily as needed for diarrhea or loose stools.  . Magnesium Oxide 400 MG CAPS Take 1 capsule 4 times a day.  May cause diarrhea.  . meclizine (ANTIVERT) 12.5 MG tablet Take 1 tablet (12.5 mg total) by mouth 3 (three) times daily as  needed for dizziness.  . mirtazapine (REMERON) 15 MG tablet Take 15 mg by mouth at bedtime.  . Multiple Vitamin (MULTIVITAMIN WITH MINERALS) TABS tablet Take 1 tablet by mouth daily.  Marland Kitchen omeprazole (PRILOSEC) 40 MG capsule Take 40 mg by mouth daily.   . ondansetron (ZOFRAN ODT) 4 MG disintegrating tablet Take 1 tablet (4 mg total) by mouth every 8 (eight) hours as needed for nausea or vomiting.  . potassium chloride (K-DUR) 10 MEQ tablet Take 10 mEq by mouth 2 (two) times daily.     Allergies:   Codeine, Tramadol, Vicodin [hydrocodone-acetaminophen], and Penicillins   Social History   Socioeconomic History  . Marital status: Widowed    Spouse name: Not on file  . Number of children: Not on file  . Years of education: Not on file  . Highest education level: Not on file  Occupational History  . Not on file  Social Needs  . Financial resource strain: Not on file  . Food insecurity    Worry: Not on file    Inability: Not on file  . Transportation needs    Medical: Not on file    Non-medical: Not on file  Tobacco Use  . Smoking status: Never Smoker  . Smokeless tobacco: Never Used  Substance and Sexual Activity  . Alcohol use: Never    Frequency: Never  . Drug use: Never  . Sexual activity: Not on file  Lifestyle  . Physical activity    Days per week: Not on file    Minutes per session: Not on file  . Stress: Not on file  Relationships  . Social Herbalist on phone: Not on file    Gets together: Not on file    Attends religious service: Not on file    Active member of club or organization: Not on file    Attends meetings of clubs or organizations: Not on file    Relationship status: Not on file  Other Topics Concern  . Not on file  Social History Narrative  . Not on file     Family History: The patient's family history is not on file.  ROS:   Please see the history of present illness.    All other systems reviewed and are negative.  EKGs/Labs/Other  Studies Reviewed:    The following studies were reviewed today: EKG from emergency room visit reveals sinus rhythm and nonspecific ST-T changes.   Recent Labs: 07/10/2018: ALT 20 01/25/2019: TSH 0.920 04/21/2019: BUN 13; Creatinine, Ser 0.90; Hemoglobin 12.6; Magnesium 1.9; Platelets 257; Potassium 3.6; Sodium 139  Recent Lipid Panel No results found for: CHOL, TRIG, HDL, CHOLHDL, VLDL, LDLCALC, LDLDIRECT  Physical Exam:    VS:  BP 128/72 (  BP Location: Left Arm, Patient Position: Sitting, Cuff Size: Normal)   Pulse 72   Ht 5' 4.5" (1.638 m)   Wt 143 lb (64.9 kg)   SpO2 98%   BMI 24.17 kg/m     Wt Readings from Last 3 Encounters:  05/20/19 143 lb (64.9 kg)  04/21/19 143 lb (64.9 kg)  04/05/19 143 lb (64.9 kg)     GEN: Patient is in no acute distress HEENT: Normal NECK: No JVD; No carotid bruits LYMPHATICS: No lymphadenopathy CARDIAC: S1 S2 regular, 2/6 systolic murmur at the apex. RESPIRATORY:  Clear to auscultation without rales, wheezing or rhonchi  ABDOMEN: Soft, non-tender, non-distended MUSCULOSKELETAL:  No edema; No deformity  SKIN: Warm and dry NEUROLOGIC:  Alert and oriented x 3 PSYCHIATRIC:  Normal affect    Signed, Jenean Lindau, MD  05/20/2019 10:52 AM    [place]

## 2019-05-21 ENCOUNTER — Telehealth (HOSPITAL_COMMUNITY): Payer: Self-pay | Admitting: *Deleted

## 2019-05-21 ENCOUNTER — Telehealth (HOSPITAL_COMMUNITY): Payer: Self-pay | Admitting: Radiology

## 2019-05-21 NOTE — Telephone Encounter (Signed)
Patient given detailed instructions per Myocardial Perfusion Study Information Sheet for the test on 05/22/2019 at 7:30. Patient notified to arrive 15 minutes early and that it is imperative to arrive on time for appointment to keep from having the test rescheduled.  If you need to cancel or reschedule your appointment, please call the office within 24 hours of your appointment. . Patient verbalized understanding.EHK

## 2019-05-21 NOTE — Telephone Encounter (Signed)
Attempted to call patient and no answer or voicemail set up. Patients daughter on emergency contact called and said she would get in touch with her about her appointment and call us back.Shannon Farrell, [place]anae [place]

## 2019-05-22 ENCOUNTER — Telehealth: Payer: Self-pay

## 2019-05-22 ENCOUNTER — Ambulatory Visit (HOSPITAL_COMMUNITY): Payer: Medicare HMO | Attending: Cardiology

## 2019-05-22 ENCOUNTER — Telehealth: Payer: Self-pay | Admitting: Cardiology

## 2019-05-22 ENCOUNTER — Other Ambulatory Visit: Payer: Self-pay

## 2019-05-22 VITALS — Ht 64.5 in | Wt 143.0 lb

## 2019-05-22 DIAGNOSIS — R079 Chest pain, unspecified: Secondary | ICD-10-CM | POA: Insufficient documentation

## 2019-05-22 LAB — MYOCARDIAL PERFUSION IMAGING
LV dias vol: 49 mL (ref 46–106)
LV sys vol: 12 mL
Peak HR: 103 {beats}/min
Rest HR: 68 {beats}/min
SDS: 6
SRS: 1
SSS: 7
TID: 0.96

## 2019-05-22 MED ORDER — TECHNETIUM TC 99M TETROFOSMIN IV KIT
10.1000 | PACK | Freq: Once | INTRAVENOUS | Status: AC | PRN
  Administered 2019-05-22: 10.1 via INTRAVENOUS
  Filled 2019-05-22: qty 11

## 2019-05-22 MED ORDER — TECHNETIUM TC 99M TETROFOSMIN IV KIT
30.5000 | PACK | Freq: Once | INTRAVENOUS | Status: AC | PRN
  Administered 2019-05-22: 30.5 via INTRAVENOUS
  Filled 2019-05-22: qty 31

## 2019-05-22 MED ORDER — REGADENOSON 0.4 MG/5ML IV SOLN
0.4000 mg | Freq: Once | INTRAVENOUS | Status: AC
  Administered 2019-05-22: 0.4 mg via INTRAVENOUS

## 2019-05-22 NOTE — Telephone Encounter (Signed)
New Message   Pt c/o of Chest Pain: STAT if CP now or developed within 24 hours  1. Are you having CP right now? Yes  2. Are you experiencing any other symptoms (ex. SOB, nausea, vomiting, sweating)? No  3. How long have you been experiencing CP? This morning  4. Is your CP continuous or coming and going? Coming and Going  5. Have you taken Nitroglycerin? No ?  Patient wants to know if she can take 2 (500 mg) tylenol to help with the pain.

## 2019-05-22 NOTE — Telephone Encounter (Signed)
Patient states she's been having sharp chest pains intermittently since Aug, that was reasoning for her office visit with [facility] on Monday. Pains reoccurred today after lexi scan.NO SOB or edema. RN advised patient to seek immediate medical attention if symptoms worsen. Agreed that patient could take 500mg  tylenol and try to rest today. Note relayed to Dr. Docia Furl for advisement.

## 2019-05-22 NOTE — Telephone Encounter (Signed)
agree

## 2019-05-23 ENCOUNTER — Telehealth: Payer: Self-pay

## 2019-05-23 NOTE — Telephone Encounter (Signed)
Left message that lexi results were good, copy sent to Dr. Terence Lux.

## 2019-05-23 NOTE — Telephone Encounter (Signed)
-----   Message from Jenean Lindau, MD sent at 05/23/2019  8:42 AM EDT ----- The results of the study is unremarkable. Please inform patient. I will discuss in detail at next appointment. Cc  primary care/referring physician Jenean Lindau, MD 05/23/2019 8:42 AM

## 2019-07-27 ENCOUNTER — Other Ambulatory Visit: Payer: Self-pay

## 2019-07-27 ENCOUNTER — Emergency Department (HOSPITAL_BASED_OUTPATIENT_CLINIC_OR_DEPARTMENT_OTHER)
Admission: EM | Admit: 2019-07-27 | Discharge: 2019-07-28 | Disposition: A | Discharge status: Home / Self Care | Payer: Medicare HMO | Attending: Emergency Medicine | Admitting: Emergency Medicine

## 2019-07-27 ENCOUNTER — Encounter (HOSPITAL_BASED_OUTPATIENT_CLINIC_OR_DEPARTMENT_OTHER): Payer: Self-pay | Admitting: Emergency Medicine

## 2019-07-27 DIAGNOSIS — M62830 Muscle spasm of back: Secondary | ICD-10-CM | POA: Insufficient documentation

## 2019-07-27 DIAGNOSIS — Z79899 Other long term (current) drug therapy: Secondary | ICD-10-CM | POA: Insufficient documentation

## 2019-07-27 DIAGNOSIS — M549 Dorsalgia, unspecified: Secondary | ICD-10-CM | POA: Diagnosis present

## 2019-07-27 NOTE — ED Triage Notes (Signed)
Pt reports upper R scapula/back pain for two weeks, seen by "lung doctor". States motrin not effective for pain.

## 2019-07-28 MED ORDER — MELOXICAM 7.5 MG PO TABS: ORAL_TABLET | ORAL | 0 refills | Status: DC

## 2019-07-28 MED ORDER — CYCLOBENZAPRINE HCL 10 MG PO TABS: 10.0000 mg | ORAL_TABLET | Freq: Three times a day (TID) | ORAL | 0 refills | Status: DC | PRN

## 2019-07-28 NOTE — ED Provider Notes (Signed)
[facility] Provider Note: Georgena Spurling, MD, FACEP  CSN: [id number] MRN: [id number] ARRIVAL: 07/27/19 at [place]: [place]  07/28/19 3:12 AM Shannon Farrell is a 69 y.o. female complains of pain in various regions of her back over the past several weeks.  The pain began initially on her left upper back then moved to her right lower back and is now in her right upper back.  The pain is located in the soft tissue of the back and is worse with movement or palpation.  She rates it as a 9 out of 10.  Ibuprofen has not been effective for her pain and has been upsetting her stomach. She has been having shortness of breath "for a long time" and she recently saw a pulmonologist.  She was diagnosed with scarring of the lungs but a recent CT scan reportedly showed minimal fibrosis.  She was given an albuterol inhaler but she states another physician told her not to use it because "it would damage her lungs".  Her CT results could not be obtained through [facility].   Past Medical History:  Diagnosis Date  . Anxiety   . Cancer (HCC)    breast  . Irritable bowel syndrome   . Thyroid disease     Past Surgical History:  Procedure Laterality Date  . ABDOMINAL HYSTERECTOMY     partial  . BREAST LUMPECTOMY      History reviewed. No pertinent family history.  Social History   Tobacco Use  . Smoking status: Never Smoker  . Smokeless tobacco: Never Used  Substance Use Topics  . Alcohol use: Never  . Drug use: Never    Prior to Admission medications   Medication Sig Start Date End Date Taking? Authorizing Provider  acetaminophen (TYLENOL) 500 MG tablet Take 500 mg by mouth every 6 (six) hours as needed for moderate pain.    [provider]  albuterol (VENTOLIN HFA) 108 (90 Base) MCG/ACT inhaler  05/08/19   [provider]  calcium carbonate (OS-CAL - DOSED IN MG OF ELEMENTAL CALCIUM) 1250 (500  Ca) MG tablet Take 1 tablet by mouth daily with breakfast.    [provider]  cetirizine (ZYRTEC ALLERGY) 10 MG tablet Take 10 mg by mouth daily.    [provider]  Cholecalciferol (VITAMIN D) 50 MCG (2000 UT) tablet Take 4,000 Units by mouth daily.    [provider]  cyclobenzaprine (FLEXERIL) 10 MG tablet Take 1 tablet (10 mg total) by mouth 3 (three) times daily as needed for muscle spasms. 07/28/19   Johneric Mcfadden, MD  dicyclomine (BENTYL) 10 MG capsule Take 10 mg by mouth 4 (four) times daily -  before meals and at bedtime.    [provider]  DULoxetine (CYMBALTA) 30 MG capsule Take 30 mg by mouth daily.    [provider]  fluticasone Asencion Islam) 50 MCG/ACT nasal spray  03/08/19   [provider]  lactose free nutrition (BOOST) LIQD Take 237 mLs by mouth 2 (two) times daily between meals.    [provider]  levocetirizine (XYZAL) 5 MG tablet  05/16/19   [provider]  levothyroxine (SYNTHROID, LEVOTHROID) 75 MCG tablet Take 75 mcg by mouth daily before breakfast.    [provider]  loperamide (IMODIUM) 2 MG capsule Take 1 capsule (2 mg total) by mouth 3 (three) times daily as needed for diarrhea or loose stools. 07/10/18  Caccavale, Sophia, PA-C  Magnesium Oxide 400 MG CAPS Take 1 capsule 4 times a day.  May cause diarrhea. 04/06/19   Teena Mangus, Jenny Reichmann, MD  meclizine (ANTIVERT) 12.5 MG tablet Take 1 tablet (12.5 mg total) by mouth 3 (three) times daily as needed for dizziness. 07/26/18   Robinson, [place] N, PA-C  meloxicam (MOBIC) 7.5 MG tablet Take 1 tablet once or twice daily as needed for pain. 07/28/19   Shahd Occhipinti, MD  mirtazapine (REMERON) 15 MG tablet Take 15 mg by mouth at bedtime.    [provider]  Multiple Vitamin (MULTIVITAMIN WITH MINERALS) TABS tablet Take 1 tablet by mouth daily.    [provider]  omeprazole (PRILOSEC) 40 MG capsule Take 40 mg by mouth daily.     [provider]  ondansetron (ZOFRAN ODT) 4 MG disintegrating tablet Take 1 tablet (4 mg total) by mouth every 8 (eight) hours as needed for nausea or vomiting. 07/26/18   Robinson, [place] N, PA-C  potassium chloride (K-DUR) 10 MEQ tablet Take 10 mEq by mouth 2 (two) times daily.    [provider]    Allergies Bee venom, Codeine, Tramadol, Vicodin [hydrocodone-acetaminophen], and Penicillins   REVIEW OF SYSTEMS  Negative except as noted here or in the History of Present Illness.   PHYSICAL EXAMINATION  Initial Vital Signs Blood pressure 103/74, pulse 69, temperature 98.2 F (36.8 C), temperature source Oral, resp. rate 20, height 5' 4.5" (1.638 m), weight 62.6 kg, SpO2 97 %.  Examination General: Well-developed, well-nourished female in no acute distress; appearance consistent with age of record HENT: normocephalic; atraumatic Eyes: Normal appearance Neck: supple Heart: regular rate and rhythm Lungs: clear to auscultation bilaterally Abdomen: soft; nondistended; nontender; bowel sounds present Back: Right upper paraspinal muscle tenderness Extremities: No deformity; full range of motion Neurologic: Awake, alert and oriented; motor function intact in all extremities and symmetric; no facial droop Skin: Warm and dry Psychiatric: Normal mood and affect   RESULTS  Summary of this visit's results, reviewed and interpreted by myself:   EKG Interpretation  Date/Time:    Ventricular Rate:    PR Interval:    QRS Duration:   QT Interval:    QTC Calculation:   R Axis:     Text Interpretation:        Laboratory Studies: No results found for this or any previous visit (from the past 24 hour(s)). Imaging Studies: No results found.  ED COURSE and MDM  Nursing notes, initial and subsequent vitals signs, including pulse oximetry, reviewed and interpreted by myself.  Vitals:   07/27/19 2256 07/27/19 2257 07/27/19 2259 07/28/19 0237  BP: 130/84   103/74  Pulse: 80   69  Resp: 18    20  Temp:   98.2 F (36.8 C)   TempSrc:   Oral   SpO2: 98%   97%  Weight:  62.6 kg    Height:  5' 4.5" (1.638 m)     Medications - No data to display  The patient's back pain appears to be related to muscle spasms on examination.  We will treat with Flexeril as well as switch her from ibuprofen to Mobic.  PROCEDURES  Procedures   ED DIAGNOSES     ICD-10-CM   1. Muscle spasm of back  M62.830        Arleth Mccullar, Jenny Reichmann, MD 07/28/19 [phone]

## 2019-07-28 NOTE — ED Notes (Signed)
MD with pt  

## 2019-12-12 ENCOUNTER — Encounter (HOSPITAL_BASED_OUTPATIENT_CLINIC_OR_DEPARTMENT_OTHER): Payer: Self-pay | Admitting: Emergency Medicine

## 2019-12-12 ENCOUNTER — Observation Stay (HOSPITAL_BASED_OUTPATIENT_CLINIC_OR_DEPARTMENT_OTHER)
Admission: EM | Admit: 2019-12-12 | Discharge: 2019-12-13 | Disposition: A | Discharge status: Home / Self Care | Payer: Medicare HMO | Attending: Internal Medicine | Admitting: Internal Medicine

## 2019-12-12 ENCOUNTER — Other Ambulatory Visit: Payer: Self-pay

## 2019-12-12 DIAGNOSIS — Z79899 Other long term (current) drug therapy: Secondary | ICD-10-CM | POA: Insufficient documentation

## 2019-12-12 DIAGNOSIS — F419 Anxiety disorder, unspecified: Secondary | ICD-10-CM | POA: Insufficient documentation

## 2019-12-12 DIAGNOSIS — Z853 Personal history of malignant neoplasm of breast: Secondary | ICD-10-CM | POA: Diagnosis not present

## 2019-12-12 DIAGNOSIS — E039 Hypothyroidism, unspecified: Secondary | ICD-10-CM | POA: Diagnosis present

## 2019-12-12 DIAGNOSIS — R7303 Prediabetes: Secondary | ICD-10-CM | POA: Insufficient documentation

## 2019-12-12 DIAGNOSIS — Z880 Allergy status to penicillin: Secondary | ICD-10-CM | POA: Insufficient documentation

## 2019-12-12 DIAGNOSIS — Z79890 Hormone replacement therapy (postmenopausal): Secondary | ICD-10-CM | POA: Insufficient documentation

## 2019-12-12 DIAGNOSIS — E876 Hypokalemia: Secondary | ICD-10-CM | POA: Diagnosis present

## 2019-12-12 DIAGNOSIS — Z885 Allergy status to narcotic agent status: Secondary | ICD-10-CM | POA: Insufficient documentation

## 2019-12-12 DIAGNOSIS — Z20822 Contact with and (suspected) exposure to covid-19: Secondary | ICD-10-CM | POA: Diagnosis not present

## 2019-12-12 DIAGNOSIS — K219 Gastro-esophageal reflux disease without esophagitis: Secondary | ICD-10-CM | POA: Diagnosis not present

## 2019-12-12 DIAGNOSIS — K580 Irritable bowel syndrome with diarrhea: Secondary | ICD-10-CM | POA: Insufficient documentation

## 2019-12-12 DIAGNOSIS — Z888 Allergy status to other drugs, medicaments and biological substances status: Secondary | ICD-10-CM | POA: Insufficient documentation

## 2019-12-12 LAB — COMPREHENSIVE METABOLIC PANEL
ALT: 25 U/L (ref 0–44)
AST: 28 U/L (ref 15–41)
Albumin: 4.7 g/dL (ref 3.5–5.0)
Alkaline Phosphatase: 70 U/L (ref 38–126)
Anion gap: 13 (ref 5–15)
BUN: 12 mg/dL (ref 8–23)
CO2: 23 mmol/L (ref 22–32)
Calcium: 7.8 mg/dL — ABNORMAL LOW (ref 8.9–10.3)
Chloride: 108 mmol/L (ref 98–111)
Creatinine, Ser: 0.78 mg/dL (ref 0.44–1.00)
GFR calc Af Amer: >60 mL/min (ref >60)
GFR calc non Af Amer: >60 mL/min (ref >60)
Glucose, Bld: 96 mg/dL (ref 70–99)
Potassium: 2.7 mmol/L — CL (ref 3.5–5.1)
Sodium: 144 mmol/L (ref 135–145)
Total Bilirubin: 0.5 mg/dL (ref 0.3–1.2)
Total Protein: 7.7 g/dL (ref 6.5–8.1)

## 2019-12-12 LAB — CBC WITH DIFFERENTIAL/PLATELET
Abs Immature Granulocytes: 0.03 10*3/uL (ref 0.00–0.07)
Basophils Absolute: 0.1 10*3/uL (ref 0.0–0.1)
Basophils Relative: 1 %
Eosinophils Absolute: 0 10*3/uL (ref 0.0–0.5)
Eosinophils Relative: 0 %
HCT: 36.4 % (ref 36.0–46.0)
Hemoglobin: 12.3 g/dL (ref 12.0–15.0)
Immature Granulocytes: 0 %
Lymphocytes Relative: 31 %
Lymphs Abs: 2.8 10*3/uL (ref 0.7–4.0)
MCH: 28.2 pg (ref 26.0–34.0)
MCHC: 33.8 g/dL (ref 30.0–36.0)
MCV: 83.5 fL (ref 80.0–100.0)
Monocytes Absolute: 0.6 10*3/uL (ref 0.1–1.0)
Monocytes Relative: 6 %
Neutro Abs: 5.5 10*3/uL (ref 1.7–7.7)
Neutrophils Relative %: 62 %
Platelets: 155 10*3/uL (ref 150–400)
RBC: 4.36 MIL/uL (ref 3.87–5.11)
RDW: 14.3 % (ref 11.5–15.5)
WBC: 9 10*3/uL (ref 4.0–10.5)
nRBC: 0 % (ref 0.0–0.2)

## 2019-12-12 LAB — CBG MONITORING, ED: Glucose-Capillary: 88 mg/dL (ref 70–99)

## 2019-12-12 LAB — MAGNESIUM: Magnesium: 0.6 mg/dL — CL (ref 1.7–2.4)

## 2019-12-12 LAB — SARS CORONAVIRUS 2 BY RT PCR (HOSPITAL ORDER, PERFORMED IN ~~LOC~~ HOSPITAL LAB): SARS Coronavirus 2: NEGATIVE

## 2019-12-12 MED ORDER — SODIUM CHLORIDE 0.9 % IV SOLN
INTRAVENOUS | Status: DC | PRN
  Administered 2019-12-12: 500 mL via INTRAVENOUS

## 2019-12-12 MED ORDER — MAGNESIUM SULFATE 4 GM/100ML IV SOLN
4.0000 g | INTRAVENOUS | Status: AC
  Administered 2019-12-12: 4 g via INTRAVENOUS
  Filled 2019-12-12: qty 100

## 2019-12-12 MED ORDER — ENOXAPARIN SODIUM 40 MG/0.4ML ~~LOC~~ SOLN
40.0000 mg | SUBCUTANEOUS | Status: DC
  Administered 2019-12-12: 40 mg via SUBCUTANEOUS
  Filled 2019-12-12: qty 0.4

## 2019-12-12 MED ORDER — POTASSIUM CHLORIDE 10 MEQ/100ML IV SOLN
10.0000 meq | INTRAVENOUS | Status: AC
  Administered 2019-12-12 – 2019-12-13 (×3): 10 meq via INTRAVENOUS
  Filled 2019-12-12 (×2): qty 100

## 2019-12-12 MED ORDER — POTASSIUM CHLORIDE CRYS ER 20 MEQ PO TBCR
60.0000 meq | EXTENDED_RELEASE_TABLET | Freq: Once | ORAL | Status: AC
  Administered 2019-12-12: 60 meq via ORAL
  Filled 2019-12-12: qty 3

## 2019-12-12 NOTE — ED Notes (Signed)
Date and time results received: 12/12/19 1920   Test: magnesium  Critical Value: 0.6  Name of Provider Notified: [facility]

## 2019-12-12 NOTE — ED Triage Notes (Signed)
Pt states  She saw her dr yesterday and had labs drawn and her Iris Pert is low and she is worried , states had  Diarrhea today and felt anxious and dizzy  , lives by herself, takes meclizine for her  Vertigo and  Valium for anxiety but has not taken any today  She states

## 2019-12-12 NOTE — ED Notes (Signed)
[facility] notified (Kim) - patient ready for transport 

## 2019-12-12 NOTE — ED Notes (Signed)
Date and time results received: 12/12/19 1920   Test: potassium Critical Value: 2.7  Name of Provider Notified: Long

## 2019-12-12 NOTE — ED Provider Notes (Signed)
[facility] Provider Note   CSN: [id number] Arrival date & time: 12/12/19  1750     History No chief complaint on file.   Shannon Farrell is a 69 y.o. female with a past medical history of anxiety, thyroid disease, hypokalemia who presents emergency department with chief complaint of low potassium and diarrhea.  Patient states that she was seen her primary care physician's office had normal labs in the office and was told her potassium was significantly low.  She went back to data verified by having a second test at [facility] and was called again and told her that she was too low and needed to come to the emergency department immediately.  Patient states that she also developed diarrhea.  She had about 2 episodes of watery stool overnight and then this morning around lunchtime is had 4-5 watery diarrhea was followed by more formed stools.  She denies abdominal pain nausea vomiting palpitations or weakness.  She states that she got dizzy and short of breath when her lab called and told her she needed to come to the ER and thinks it was due to her anxiety.  He takes 800 mg of magnesium daily.  HPI     Past Medical History:  Diagnosis Date  . Anxiety   . Cancer (HCC)    breast  . Irritable bowel syndrome   . Thyroid disease     Patient Active Problem List   Diagnosis Date Noted  . Chest discomfort 05/20/2019  . Hypokalemia 01/25/2019  . Hypomagnesemia 01/25/2019  . Hypothyroidism 01/25/2019  . Vertigo 01/24/2019    Past Surgical History:  Procedure Laterality Date  . ABDOMINAL HYSTERECTOMY     partial  . BREAST LUMPECTOMY       OB History   No obstetric history on file.     No family history on file.  Social History   Tobacco Use  . Smoking status: Never Smoker  . Smokeless tobacco: Never Used  Substance Use Topics  . Alcohol use: Never  . Drug use: Never    Home Medications Prior to Admission medications   Medication Sig Start Date  End Date Taking? Authorizing Provider  acetaminophen (TYLENOL) 500 MG tablet Take 500 mg by mouth every 6 (six) hours as needed for moderate pain.    [provider]  albuterol (VENTOLIN HFA) 108 (90 Base) MCG/ACT inhaler  05/08/19   [provider]  calcium carbonate (OS-CAL - DOSED IN MG OF ELEMENTAL CALCIUM) 1250 (500 Ca) MG tablet Take 1 tablet by mouth daily with breakfast.    [provider]  cetirizine (ZYRTEC ALLERGY) 10 MG tablet Take 10 mg by mouth daily.    [provider]  Cholecalciferol (VITAMIN D) 50 MCG (2000 UT) tablet Take 4,000 Units by mouth daily.    [provider]  cyclobenzaprine (FLEXERIL) 10 MG tablet Take 1 tablet (10 mg total) by mouth 3 (three) times daily as needed for muscle spasms. 07/28/19   Molpus, John, MD  dicyclomine (BENTYL) 10 MG capsule Take 10 mg by mouth 4 (four) times daily -  before meals and at bedtime.    [provider]  DULoxetine (CYMBALTA) 30 MG capsule Take 30 mg by mouth daily.    [provider]  fluticasone Asencion Islam) 50 MCG/ACT nasal spray  03/08/19   [provider]  lactose free nutrition (BOOST) LIQD Take 237 mLs by mouth 2 (two) times daily between meals.    [provider]  levocetirizine (XYZAL) 5 MG tablet  05/16/19   [provider]  levothyroxine (SYNTHROID, LEVOTHROID) 75 MCG tablet Take 75 mcg by mouth daily before breakfast.    [provider]  loperamide (IMODIUM) 2 MG capsule Take 1 capsule (2 mg total) by mouth 3 (three) times daily as needed for diarrhea or loose stools. 07/10/18   Caccavale, Sophia, PA-C  Magnesium Oxide 400 MG CAPS Take 1 capsule 4 times a day.  May cause diarrhea. 04/06/19   Molpus, Jenny Reichmann, MD  meclizine (ANTIVERT) 12.5 MG tablet Take 1 tablet (12.5 mg total) by mouth 3 (three) times daily as needed for dizziness. 07/26/18   Robinson, [place] N, PA-C  meloxicam (MOBIC) 7.5 MG tablet Take 1 tablet once or twice daily as  needed for pain. 07/28/19   Molpus, John, MD  mirtazapine (REMERON) 15 MG tablet Take 15 mg by mouth at bedtime.    [provider]  Multiple Vitamin (MULTIVITAMIN WITH MINERALS) TABS tablet Take 1 tablet by mouth daily.    [provider]  omeprazole (PRILOSEC) 40 MG capsule Take 40 mg by mouth daily.     [provider]  ondansetron (ZOFRAN ODT) 4 MG disintegrating tablet Take 1 tablet (4 mg total) by mouth every 8 (eight) hours as needed for nausea or vomiting. 07/26/18   Robinson, [place] N, PA-C  potassium chloride (K-DUR) 10 MEQ tablet Take 10 mEq by mouth 2 (two) times daily.    [provider]    Allergies    Bee venom, Codeine, Tramadol, Vicodin [hydrocodone-acetaminophen], and Penicillins  Review of Systems   Review of Systems Ten systems reviewed and are negative for acute change, except as noted in the HPI.   Physical Exam Updated Vital Signs BP 131/84   Pulse 88   Temp 98.3 F (36.8 C)   Resp 13   Ht 5\' 4"  (1.626 m)   Wt 62.6 kg   SpO2 98%   BMI 23.69 kg/m   Physical Exam Vitals and nursing note reviewed.  Constitutional:      General: She is not in acute distress.    Appearance: She is well-developed. She is not diaphoretic.  HENT:     Head: Normocephalic and atraumatic.  Eyes:     General: No scleral icterus.    Conjunctiva/sclera: Conjunctivae normal.  Cardiovascular:     Rate and Rhythm: Normal rate and regular rhythm.     Heart sounds: Normal heart sounds. No murmur. No friction rub. No gallop.   Pulmonary:     Effort: Pulmonary effort is normal. No respiratory distress.     Breath sounds: Normal breath sounds.  Abdominal:     General: Bowel sounds are normal. There is no distension.     Palpations: Abdomen is soft. There is no mass.     Tenderness: There is no abdominal tenderness. There is no guarding.  Musculoskeletal:     Cervical back: Normal range of motion.  Skin:    General: Skin is warm and dry.    Neurological:     Mental Status: She is alert and oriented to person, place, and time.  Psychiatric:        Behavior: Behavior normal.     ED Results / Procedures / Treatments   Labs (all labs ordered are listed, but only abnormal results are displayed) Labs Reviewed  CBC WITH DIFFERENTIAL/PLATELET  MAGNESIUM  COMPREHENSIVE METABOLIC PANEL  CBG MONITORING, ED    EKG EKG Interpretation  Date/Time:  Thursday Dec 12 2019  18:00:40 EDT Ventricular Rate:  87 PR Interval:    QRS Duration: 93 QT Interval:  391 QTC Calculation: 471 R Axis:   21 Text Interpretation: Sinus rhythm Low voltage, precordial leads Probable anteroseptal infarct, old No STEMI Confirmed by Nanda Quinton [id number]) on 12/12/2019 6:20:15 PM   Radiology No results found.  Procedures .Critical Care Performed by: Margarita Mail, PA-C Authorized by: Margarita Mail, PA-C   Critical care provider statement:    Critical care time (minutes):  50   Critical care time was exclusive of:  Separately billable procedures and treating other patients   Critical care was necessary to treat or prevent imminent or life-threatening deterioration of the following conditions:  Metabolic crisis   Critical care was time spent personally by me on the following activities:  Discussions with consultants, evaluation of patient's response to treatment, examination of patient, ordering and performing treatments and interventions, ordering and review of laboratory studies, ordering and review of radiographic studies, pulse oximetry, re-evaluation of patient's condition, obtaining history from patient or surrogate and review of old charts   (including critical care time)  Medications Ordered in ED Medications - No data to display  ED Course  I have reviewed the triage vital signs and the nursing notes.  Pertinent labs & imaging results that were available during my care of the patient were reviewed by me and considered in my medical  decision making (see chart for details).    MDM Rules/Calculators/A&P                      69 year old female who presents for evaluation of hypomagnesemia.  I ordered interpreted and reviewed labs which include Covid test which is negative, CBC without abnormality.  CMP shows a potassium of 2.7 and magnesium is 0.6.  She has had no episodes of diarrhea here.  Her hypomagnesemia which is chronic is of uncertain etiology.  EKG shows normal sinus rhythm at a rate of 87 without abnormalities.  I have discussed all findings with the patient.  She will need admission.  I have started the patient on potassium repletion by IV and orally as well as IV magnesium 4 g.  Discussed the case with the hospitalist, Dr. Flossie Buffy who will admit the patient. Final Clinical Impression(s) / ED Diagnoses Final diagnoses:  None    Rx / DC Orders ED Discharge Orders    None       Margarita Mail, PA-C 12/12/19 2330    Margette Fast, MD 12/18/19 [phone]

## 2019-12-13 ENCOUNTER — Encounter (HOSPITAL_COMMUNITY): Payer: Self-pay | Admitting: Family Medicine

## 2019-12-13 DIAGNOSIS — F419 Anxiety disorder, unspecified: Secondary | ICD-10-CM | POA: Diagnosis present

## 2019-12-13 LAB — BASIC METABOLIC PANEL
Anion gap: 9 (ref 5–15)
BUN: 8 mg/dL (ref 8–23)
CO2: 21 mmol/L — ABNORMAL LOW (ref 22–32)
Calcium: 7.8 mg/dL — ABNORMAL LOW (ref 8.9–10.3)
Chloride: 112 mmol/L — ABNORMAL HIGH (ref 98–111)
Creatinine, Ser: 0.6 mg/dL (ref 0.44–1.00)
GFR calc Af Amer: >60 mL/min (ref >60)
GFR calc non Af Amer: >60 mL/min (ref >60)
Glucose, Bld: 89 mg/dL (ref 70–99)
Potassium: 4.1 mmol/L (ref 3.5–5.1)
Sodium: 142 mmol/L (ref 135–145)

## 2019-12-13 LAB — CBC
HCT: 36.5 % (ref 36.0–46.0)
Hemoglobin: 12.3 g/dL (ref 12.0–15.0)
MCH: 28.3 pg (ref 26.0–34.0)
MCHC: 33.7 g/dL (ref 30.0–36.0)
MCV: 83.9 fL (ref 80.0–100.0)
Platelets: UNDETERMINED 10*3/uL (ref 150–400)
RBC: 4.35 MIL/uL (ref 3.87–5.11)
RDW: 14.2 % (ref 11.5–15.5)
WBC: 7.1 10*3/uL (ref 4.0–10.5)
nRBC: 0 % (ref 0.0–0.2)

## 2019-12-13 LAB — MAGNESIUM: Magnesium: 2 mg/dL (ref 1.7–2.4)

## 2019-12-13 MED ORDER — DIAZEPAM 2 MG PO TABS: 2.0000 mg | ORAL_TABLET | Freq: Two times a day (BID) | ORAL | Status: DC | PRN

## 2019-12-13 MED ORDER — MECLIZINE HCL 25 MG PO TABS
12.5000 mg | ORAL_TABLET | Freq: Once | ORAL | Status: AC
  Administered 2019-12-13: 12.5 mg via ORAL
  Filled 2019-12-13: qty 1

## 2019-12-13 MED ORDER — MECLIZINE HCL 25 MG PO TABS: 12.5000 mg | ORAL_TABLET | Freq: Three times a day (TID) | ORAL | Status: DC | PRN

## 2019-12-13 MED ORDER — ONDANSETRON HCL 4 MG PO TABS: 4.0000 mg | ORAL_TABLET | Freq: Four times a day (QID) | ORAL | Status: DC | PRN

## 2019-12-13 MED ORDER — MIRTAZAPINE 15 MG PO TABS: 15.0000 mg | ORAL_TABLET | Freq: Every day | ORAL | Status: DC

## 2019-12-13 MED ORDER — ONDANSETRON HCL 4 MG/2ML IJ SOLN: 4.0000 mg | Freq: Four times a day (QID) | INTRAMUSCULAR | Status: DC | PRN

## 2019-12-13 MED ORDER — POTASSIUM CHLORIDE 10 MEQ/100ML IV SOLN
INTRAVENOUS | Status: AC
  Filled 2019-12-13: qty 100

## 2019-12-13 MED ORDER — SODIUM CHLORIDE 0.9% FLUSH: 3.0000 mL | Freq: Two times a day (BID) | INTRAVENOUS | Status: DC

## 2019-12-13 MED ORDER — ACETAMINOPHEN 325 MG PO TABS: 650.0000 mg | ORAL_TABLET | Freq: Four times a day (QID) | ORAL | Status: DC | PRN

## 2019-12-13 MED ORDER — MAGNESIUM OXIDE 400 (241.3 MG) MG PO TABS
400.0000 mg | ORAL_TABLET | Freq: Three times a day (TID) | ORAL | Status: DC
  Administered 2019-12-13: 400 mg via ORAL
  Filled 2019-12-13: qty 1

## 2019-12-13 MED ORDER — ENOXAPARIN SODIUM 40 MG/0.4ML ~~LOC~~ SOLN: 40.0000 mg | SUBCUTANEOUS | Status: DC

## 2019-12-13 MED ORDER — ACETAMINOPHEN 650 MG RE SUPP: 650.0000 mg | Freq: Four times a day (QID) | RECTAL | Status: DC | PRN

## 2019-12-13 MED ORDER — LEVOTHYROXINE SODIUM 75 MCG PO TABS
75.0000 ug | ORAL_TABLET | Freq: Every day | ORAL | Status: DC
  Administered 2019-12-13: 75 ug via ORAL
  Filled 2019-12-13: qty 1

## 2019-12-13 MED ORDER — SODIUM CHLORIDE 0.9% FLUSH: 3.0000 mL | INTRAVENOUS | Status: DC | PRN

## 2019-12-13 MED ORDER — POTASSIUM CHLORIDE CRYS ER 10 MEQ PO TBCR
10.0000 meq | EXTENDED_RELEASE_TABLET | Freq: Two times a day (BID) | ORAL | Status: DC
  Administered 2019-12-13: 10 meq via ORAL
  Filled 2019-12-13 (×2): qty 1

## 2019-12-13 MED ORDER — SODIUM CHLORIDE 0.9 % IV SOLN: 250.0000 mL | INTRAVENOUS | Status: DC | PRN

## 2019-12-13 MED ORDER — SODIUM CHLORIDE 0.9% FLUSH
3.0000 mL | Freq: Two times a day (BID) | INTRAVENOUS | Status: DC
  Administered 2019-12-13: 3 mL via INTRAVENOUS

## 2019-12-13 NOTE — H&P (Addendum)
History and Physical    Shannon Farrell [id number] DOB: Jul 25, 1951 DOA: 12/12/2019  PCP: Glendon Axe, MD   Patient coming from: Home   Chief Complaint: Abnormal outpatient blood work   HPI: Shannon Farrell is a 69 y.o. female with medical history significant for breast cancer status, hypothyroidism, chronic hypomagnesemia, vertigo, and anxiety, now presenting to the emergency department for evaluation of abnormal outpatient blood work.  Patient reports that she recently had labs performed as part of her annual physical, was found to have low potassium and low magnesium, and was directed to the ED for further evaluation of this.  She has a history of chronic hypomagnesemia and had been taking oral magnesium and potassium replacements at home.  She did not have any vomiting or diarrhea prior to the lab draw but subsequently had some loose stools. She denies weakness or palpitations. She takes 80 mg omeprazole every day due to history of indigestion.    [facility] ED Course: Upon arrival to the ED, patient is found to be afebrile, saturating well on room air, and with stable blood pressure.  EKG features sinus rhythm.  Chemistry panel notable for potassium of 2.7 and magnesium of 0.6.  CBC is unremarkable.  COVID-19 screening test is negative.  Patient was given 60 mEq of oral potassium, 30 mEq IV potassium, and 4 g of IV magnesium in the ED.  She was subsequently transferred to [facility] for ongoing evaluation and management.  Review of Systems:  All other systems reviewed and apart from HPI, are negative.  Past Medical History:  Diagnosis Date  . Anxiety   . Cancer (HCC)    breast  . Irritable bowel syndrome   . Thyroid disease     Past Surgical History:  Procedure Laterality Date  . ABDOMINAL HYSTERECTOMY     partial  . BREAST LUMPECTOMY       reports that she has never smoked. She has never used smokeless tobacco. She reports that she does not drink  alcohol or use drugs.  Allergies  Allergen Reactions  . Bee Venom   . Codeine Nausea And Vomiting    Anxiety  . Tramadol Nausea And Vomiting    Anxiety  . Vicodin [Hydrocodone-Acetaminophen] Nausea And Vomiting    Anxiety  . Penicillins Diarrhea    DID THE REACTION INVOLVE: Swelling of the face/tongue/throat, SOB, or low BP? No nut causes diarrhea  Sudden or severe rash/hives, skin peeling, or the inside of the mouth or nose? N Did it require medical treatment? Y When did it last happen?November 2019. If all above answers are "NO", may proceed with cephalosporin use.     History reviewed. No pertinent family history.   Prior to Admission medications   Medication Sig Start Date End Date Taking? Authorizing Provider  acetaminophen (TYLENOL) 500 MG tablet Take 500 mg by mouth every 6 (six) hours as needed for moderate pain.    [provider]  albuterol (VENTOLIN HFA) 108 (90 Base) MCG/ACT inhaler  05/08/19   [provider]  calcium carbonate (OS-CAL - DOSED IN MG OF ELEMENTAL CALCIUM) 1250 (500 Ca) MG tablet Take 1 tablet by mouth daily with breakfast.    [provider]  cetirizine (ZYRTEC ALLERGY) 10 MG tablet Take 10 mg by mouth daily.    [provider]  Cholecalciferol (VITAMIN D) 50 MCG (2000 UT) tablet Take 4,000 Units by mouth daily.    [provider]  cyclobenzaprine (FLEXERIL) 10 MG tablet Take  1 tablet (10 mg total) by mouth 3 (three) times daily as needed for muscle spasms. 07/28/19   Molpus, John, MD  diazepam (VALIUM) 2 MG tablet Take 2 mg by mouth 2 (two) times daily as needed. 10/24/19   [provider]  diclofenac (VOLTAREN) 50 MG EC tablet diclofenac sodium 50 mg tablet,delayed release  TAKE 1 TABLET BY MOUTH EVERY DAY    [provider]  dicyclomine (BENTYL) 10 MG capsule Take 10 mg by mouth 4 (four) times daily -  before meals and at bedtime.    [provider]  DULoxetine (CYMBALTA) 30 MG  capsule Take 30 mg by mouth daily.    [provider]  fluticasone Asencion Islam) 50 MCG/ACT nasal spray  03/08/19   [provider]  lactose free nutrition (BOOST) LIQD Take 237 mLs by mouth 2 (two) times daily between meals.    [provider]  levocetirizine (XYZAL) 5 MG tablet  05/16/19   [provider]  levothyroxine (SYNTHROID, LEVOTHROID) 75 MCG tablet Take 75 mcg by mouth daily before breakfast.    [provider]  loperamide (IMODIUM) 2 MG capsule Take 1 capsule (2 mg total) by mouth 3 (three) times daily as needed for diarrhea or loose stools. 07/10/18   Caccavale, Sophia, PA-C  Magnesium Oxide 400 MG CAPS Take 1 capsule 4 times a day.  May cause diarrhea. 04/06/19   Molpus, Jenny Reichmann, MD  meclizine (ANTIVERT) 12.5 MG tablet Take 1 tablet (12.5 mg total) by mouth 3 (three) times daily as needed for dizziness. 07/26/18   Robinson, [place] N, PA-C  meloxicam (MOBIC) 7.5 MG tablet Take 1 tablet once or twice daily as needed for pain. 07/28/19   Molpus, John, MD  mirtazapine (REMERON) 15 MG tablet Take 15 mg by mouth at bedtime.    [provider]  Multiple Vitamin (MULTIVITAMIN WITH MINERALS) TABS tablet Take 1 tablet by mouth daily.    [provider]  omeprazole (PRILOSEC) 40 MG capsule Take 40 mg by mouth daily.     [provider]  ondansetron (ZOFRAN ODT) 4 MG disintegrating tablet Take 1 tablet (4 mg total) by mouth every 8 (eight) hours as needed for nausea or vomiting. 07/26/18   Robinson, [place] N, PA-C  oxycodone (OXY-IR) 5 MG capsule Take by mouth.    [provider]  potassium chloride (K-DUR) 10 MEQ tablet Take 10 mEq by mouth 2 (two) times daily.    [provider]    Physical Exam: Vitals:   12/12/19 2230 12/12/19 2300 12/13/19 0200 12/13/19 0345  BP: 119/79 114/78 129/78 115/79  Pulse: 80 78 (!) 112 73  Resp: 12 19 20 18   Temp:   98.2 F (36.8 C) 98.1 F (36.7 C)  TempSrc:    Oral  SpO2: 96% 95%  95% 100%  Weight:    62.2 kg  Height:    5\' 4"  (1.626 m)     Constitutional: NAD, calm  Eyes: PERTLA, lids and conjunctivae normal ENMT: Mucous membranes are moist. Posterior pharynx clear of any exudate or lesions.   Neck: normal, supple, no masses, no thyromegaly Respiratory: no wheezing, no crackles. No accessory muscle use.  Cardiovascular: S1 & S2 heard, regular rate and rhythm. No extremity edema.   Abdomen: No distension, no tenderness, soft. Bowel sounds active.  Musculoskeletal: no clubbing / cyanosis. No joint deformity upper and lower extremities.   Skin: no significant rashes, lesions, ulcers. Warm, dry, well-perfused. Neurologic: No facial asymmetry. Sensation intact. Moving  all extremities.  Psychiatric: Alert and oriented to person, place, and situation. Pleasant and cooperative.    Labs and Imaging on Admission: I have personally reviewed following labs and imaging studies  CBC: Recent Labs  Lab 12/12/19 1836  WBC 9.0  NEUTROABS 5.5  HGB 12.3  HCT 36.4  MCV 83.5  PLT 99991111   Basic Metabolic Panel: Recent Labs  Lab 12/12/19 1836  NA 144  K 2.7*  CL 108  CO2 23  GLUCOSE 96  BUN 12  CREATININE 0.78  CALCIUM 7.8*  MG 0.6*   GFR: Estimated Creatinine Clearance: 58.9 mL/min (by C-G formula based on SCr of 0.78 mg/dL). Liver Function Tests: Recent Labs  Lab 12/12/19 1836  AST 28  ALT 25  ALKPHOS 70  BILITOT 0.5  PROT 7.7  ALBUMIN 4.7   No results for input(s): LIPASE, AMYLASE in the last 168 hours. No results for input(s): AMMONIA in the last 168 hours. Coagulation Profile: No results for input(s): INR, PROTIME in the last 168 hours. Cardiac Enzymes: No results for input(s): CKTOTAL, CKMB, CKMBINDEX, TROPONINI in the last 168 hours. BNP (last 3 results) No results for input(s): PROBNP in the last 8760 hours. HbA1C: No results for input(s): HGBA1C in the last 72 hours. CBG: Recent Labs  Lab 12/12/19 1814  GLUCAP 88   Lipid Profile: No  results for input(s): CHOL, HDL, LDLCALC, TRIG, CHOLHDL, LDLDIRECT in the last 72 hours. Thyroid Function Tests: No results for input(s): TSH, T4TOTAL, FREET4, T3FREE, THYROIDAB in the last 72 hours. Anemia Panel: No results for input(s): VITAMINB12, FOLATE, FERRITIN, TIBC, IRON, RETICCTPCT in the last 72 hours. Urine analysis:    Component Value Date/Time   COLORURINE YELLOW 01/24/2019 2308   APPEARANCEUR CLEAR 01/24/2019 2308   LABSPEC 1.020 01/24/2019 2308   PHURINE 7.5 01/24/2019 2308   GLUCOSEU NEGATIVE 01/24/2019 2308   HGBUR NEGATIVE 01/24/2019 2308   BILIRUBINUR NEGATIVE 01/24/2019 2308   KETONESUR 15 (A) 01/24/2019 2308   PROTEINUR NEGATIVE 01/24/2019 2308   NITRITE NEGATIVE 01/24/2019 2308   LEUKOCYTESUR NEGATIVE 01/24/2019 2308   Sepsis Labs: @LABRCNTIP (procalcitonin:4,lacticidven:4) ) Recent Results (from the past 240 hour(s))  SARS Coronavirus 2 by RT PCR (hospital order, performed in [facility] hospital lab) Nasopharyngeal Nasopharyngeal Swab     Status: None   Collection Time: 12/12/19  9:06 PM   Specimen: Nasopharyngeal Swab  Result Value Ref Range Status   SARS Coronavirus 2 NEGATIVE NEGATIVE Final    Comment: (NOTE) SARS-CoV-2 target nucleic acids are NOT DETECTED. The SARS-CoV-2 RNA is generally detectable in upper and lower respiratory specimens during the acute phase of infection. The lowest concentration of SARS-CoV-2 viral copies this assay can detect is 250 copies / mL. A negative result does not preclude SARS-CoV-2 infection and should not be used as the sole basis for treatment or other patient management decisions.  A negative result may occur with improper specimen collection / handling, submission of specimen other than nasopharyngeal swab, presence of viral mutation(s) within the areas targeted by this assay, and inadequate number of viral copies (<250 copies / mL). A negative result must be combined with clinical observations, patient history,  and epidemiological information. Fact Sheet for Patients:   StrictlyIdeas.no Fact Sheet for Healthcare Providers: BankingDealers.co.za This test is not yet approved or cleared  by the [place] FDA and has been authorized for detection and/or diagnosis of SARS-CoV-2 by FDA under an Emergency Use Authorization (EUA).  This EUA will remain in effect (meaning this test can  be used) for the duration of the COVID-19 declaration under Section 564(b)(1) of the Act, 21 U.S.C. section 360bbb-3(b)(1), unless the authorization is terminated or revoked sooner. Performed at [facility], [address] [place], [place] [address]      Radiological Exams on Admission: No results found.  EKG: Independently reviewed. Sinus rhythm, rate 87, QTc 431.   Assessment/Plan   1. Hypomagnesemia; hypokalemia  - Presents at direction of outpatient physician due to low potassium and low magnesium and is found to have serum potassium of 2.7 and magnesium of 0.6  - Hypokalemia likely secondary to her chronic hypomagnesemia of unclear etiology, possible related to PPI therapy  - She was treated in ED with 4 g IV magnesium, 60 mEq oral potassium, and 30 mEq IV potassium  - Repeat serum chemistries now and continue to replace electrolytes as indicated    2. Hypothyroidism  - Continue Synthroid    3. Anxiety  - Stable  - Continue Remeron and as-needed Valium   4. 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 Vertigo  - Recently saw ENT, diagnosed with BPPV and referred to vestibular rehab  - Currently asymptomatic     DVT prophylaxis: Lovenox  Code Status: Full  Family Communication: Discussed with patient  Disposition Plan:  Patient is from: Home  Anticipated d/c is to: Home    Anticipated d/c date is: 12/14/19  Patient currently: Pending improvement in critical electrolyte derangement  Consults called: None  Admission status: Observation     Vianne Bulls, MD [facility] Pager: See [email or web address]  If 7AM-7PM, please contact the daytime attending [email or web address]  12/13/2019, 3:56 AM

## 2019-12-13 NOTE — Discharge Summary (Signed)
Physician Discharge Summary  Shannon Farrell [id number] DOB: 1950-12-20 DOA: 12/12/2019  PCP: Glendon Axe, MD  Admit date: 12/12/2019 Discharge date: 12/13/2019  Admitted From: home Disposition:  home  Recommendations for Outpatient Follow-up:  1. Follow up with PCP in 1-2 weeks 2. Please obtain BMP/CBC in one week 3. Please follow up on the following pending results:  Home Health:no  Equipment/Devices: none  Discharge Condition: Stable Code Status: full Diet recommendation:  Diet Order            Diet Heart Room service appropriate? Yes; Fluid consistency: Thin  Diet effective now        Diet - low sodium heart healthy             Brief/Interim Summary: As per admitting 69 y.o. female with medical history significant for breast cancer status, hypothyroidism, chronic hypomagnesemia, vertigo, and anxiety, now presenting to the emergency department for evaluation of abnormal outpatient blood work.  Patient reports that she recently had labs performed as part of her annual physical, was found to have low potassium and low magnesium, and was directed to the ED for further evaluation of this.  She has a history of chronic hypomagnesemia and had been taking oral magnesium and potassium replacements at home.  She did not have any vomiting or diarrhea prior to the lab draw but subsequently had some loose stools. She denies weakness or palpitations. She takes 80 mg omeprazole every day due to history of indigestion.    [facility] ED Course: Upon arrival to the ED, patient is found to be afebrile, saturating well on room air, and with stable blood pressure.  EKG features sinus rhythm.  Chemistry panel notable for potassium of 2.7 and magnesium of 0.6.  CBC is unremarkable.  COVID-19 screening test is negative.  Patient was given 60 mEq of oral potassium, 30 mEq IV potassium, and 4 g of IV magnesium in the ED.  She was subsequently transferred to [facility] for ongoing  evaluation and management.  Patient was admitted with abnormal lab hypokalemia and hypomagnesemia that was aggressively replaced overnight.she did well next day and did well.  Discharge Diagnoses:  Principal Problem: Hypomagnesemia:Resolved.Patient does take magnesium supplements at home and already encouraged to do so.  Likely from high-dose PPI frequent BM.  Discussed about trying to cut down on PPI and follow-up with PCP and GI Hypokalemia:Resolved.Advised potassium supplementation/potassium rich diet and follow-up routine labs at PCP office in a week Frequent Bowel Movements- chronic uses.Add imodium prn. May need GI follow ups. Currently non tender/no nausea or vomiting. Has IBS per her report. Hypothyroidism: Continue home Synthroid Anxiety: Stable on Remeron and as needed Valium. History of breast cancer she will continue to follow-up with her oncology Vertigo recently saw ENT diagnosed with BPPV and referred to vestibular rehab. Some dizziness this am- checked Orthostatics and stable,she ambulated ad no no issues. Cont meclizine. Prediabetes- cont on diet restriction. GERD- pt reports she is taking 80 of metoprolol once a day, advised on going back on 40 and hopefully to 20 mg at it may be causing her low magnesium and eventually low potassium  Consults:  None  Subjective: Mild dizziness this am No Bm today No nausea or vomitting  Discharge Exam: Vitals:   12/13/19 0200 12/13/19 0345  BP: 129/78 115/79  Pulse: (!) 112 73  Resp: 20 18  Temp: 98.2 F (36.8 C) 98.1 F (36.7 C)  SpO2: 95% 100%   General: Pt is alert, awake,  not in acute distress Cardiovascular: RRR, S1/S2 +, no rubs, no gallops Respiratory: CTA bilaterally, no wheezing, no rhonchi Abdominal: Soft, NT, ND, bowel sounds + Extremities: no edema, no cyanosis.  Discharge Instructions  Discharge Instructions    Diet - low sodium heart healthy   Complete by: As directed    Discharge instructions   Complete  by: As directed    Please call call MD or return to ER for similar or worsening recurring problem that brought you to hospital or if any fever,nausea/vomiting,abdominal pain, uncontrolled pain, chest pain,  shortness of breath or any other alarming symptoms.  Please follow-up your doctor as instructed in a week for labs check  Continue with your potassium managed supplementation.  Please avoid alcohol, smoking, or any other illicit substance and maintain healthy habits including taking your regular medications as prescribed.  You were cared for by a hospitalist during your hospital stay. If you have any questions about your discharge medications or the care you received while you were in the hospital after you are discharged, you can call the unit and ask to speak with the hospitalist on call if the hospitalist that took care of you is not available.  Once you are discharged, your primary care physician will handle any further medical issues. Please note that NO REFILLS for any discharge medications will be authorized once you are discharged, as it is imperative that you return to your primary care physician (or establish a relationship with a primary care physician if you do not have one) for your aftercare needs so that they can reassess your need for medications and monitor your lab values   Increase activity slowly   Complete by: As directed      Allergies as of 12/13/2019      Reactions   Bee Venom    Codeine Nausea And Vomiting   Anxiety   Tramadol Nausea And Vomiting   Anxiety   Vicodin [hydrocodone-acetaminophen] Nausea And Vomiting   Anxiety   Penicillins Diarrhea   DID THE REACTION INVOLVE: Swelling of the face/tongue/throat, SOB, or low BP? No nut causes diarrhea  Sudden or severe rash/hives, skin peeling, or the inside of the mouth or nose? N Did it require medical treatment? Y When did it last happen?November 2019. If all above answers are "NO", may proceed with  cephalosporin use.      Medication List    TAKE these medications   acetaminophen 500 MG tablet Commonly known as: TYLENOL Take 500 mg by mouth every 6 (six) hours as needed for moderate pain.   albuterol 108 (90 Base) MCG/ACT inhaler Commonly known as: VENTOLIN HFA   calcium carbonate 1250 (500 Ca) MG tablet Commonly known as: OS-CAL - dosed in mg of elemental calcium Take 1 tablet by mouth daily with breakfast.   diazepam 2 MG tablet Commonly known as: VALIUM Take 2 mg by mouth 2 (two) times daily as needed.   fluticasone 50 MCG/ACT nasal spray Commonly known as: FLONASE Place 2 sprays into both nostrils daily.   lactose free nutrition Liqd Take 237 mLs by mouth 2 (two) times daily between meals.   levocetirizine 5 MG tablet Commonly known as: XYZAL Take 5 mg by mouth every evening.   levothyroxine 75 MCG tablet Commonly known as: SYNTHROID Take 75 mcg by mouth daily before breakfast.   magnesium oxide 400 MG tablet Commonly known as: MAG-OX Take 1 tablet by mouth 3 (three) times daily.   meclizine 12.5 MG tablet Commonly known  as: ANTIVERT Take 12.5 mg by mouth 3 (three) times daily as needed for dizziness.   mirtazapine 15 MG tablet Commonly known as: REMERON Take 15 mg by mouth at bedtime.   multivitamin with minerals Tabs tablet Take 1 tablet by mouth daily.   omeprazole 40 MG capsule Commonly known as: PRILOSEC Take 40 mg by mouth daily.   potassium chloride 10 MEQ tablet Commonly known as: KLOR-CON Take 10 mEq by mouth 2 (two) times daily.   Vitamin D 50 MCG (2000 UT) tablet Take 4,000 Units by mouth daily.      Follow-up Information    Glendon Axe, MD Follow up in 1 week(s).   Specialty: Family Medicine Why: for bmp, mag level check Contact information: [place] [address] [phone]          Allergies  Allergen Reactions  . Bee Venom   . Codeine Nausea And Vomiting    Anxiety  . Tramadol Nausea And  Vomiting    Anxiety  . Vicodin [Hydrocodone-Acetaminophen] Nausea And Vomiting    Anxiety  . Penicillins Diarrhea    DID THE REACTION INVOLVE: Swelling of the face/tongue/throat, SOB, or low BP? No nut causes diarrhea  Sudden or severe rash/hives, skin peeling, or the inside of the mouth or nose? N Did it require medical treatment? Y When did it last happen?November 2019. If all above answers are "NO", may proceed with cephalosporin use.     The results of significant diagnostics from this hospitalization (including imaging, microbiology, ancillary and laboratory) are listed below for reference.    Microbiology: Recent Results (from the past 240 hour(s))  SARS Coronavirus 2 by RT PCR (hospital order, performed in [facility] hospital lab) Nasopharyngeal Nasopharyngeal Swab     Status: None   Collection Time: 12/12/19  9:06 PM   Specimen: Nasopharyngeal Swab  Result Value Ref Range Status   SARS Coronavirus 2 NEGATIVE NEGATIVE Final    Comment: (NOTE) SARS-CoV-2 target nucleic acids are NOT DETECTED. The SARS-CoV-2 RNA is generally detectable in upper and lower respiratory specimens during the acute phase of infection. The lowest concentration of SARS-CoV-2 viral copies this assay can detect is 250 copies / mL. A negative result does not preclude SARS-CoV-2 infection and should not be used as the sole basis for treatment or other patient management decisions.  A negative result may occur with improper specimen collection / handling, submission of specimen other than nasopharyngeal swab, presence of viral mutation(s) within the areas targeted by this assay, and inadequate number of viral copies (<250 copies / mL). A negative result must be combined with clinical observations, patient history, and epidemiological information. Fact Sheet for Patients:   StrictlyIdeas.no Fact Sheet for Healthcare Providers: BankingDealers.co.za This  test is not yet approved or cleared  by the [place] FDA and has been authorized for detection and/or diagnosis of SARS-CoV-2 by FDA under an Emergency Use Authorization (EUA).  This EUA will remain in effect (meaning this test can be used) for the duration of the COVID-19 declaration under Section 564(b)(1) of the Act, 21 U.S.C. section 360bbb-3(b)(1), unless the authorization is terminated or revoked sooner. Performed at [facility], [address] [place], [place] [address]     Procedures/Studies: No results found.  Labs: BNP (last 3 results) No results for input(s): BNP in the last 8760 hours. Basic Metabolic Panel: Recent Labs  Lab 12/12/19 1836 12/13/19 0502  NA 144 142  K 2.7* 4.1  CL 108  112*  CO2 23 21*  GLUCOSE 96 89  BUN 12 8  CREATININE 0.78 0.60  CALCIUM 7.8* 7.8*  MG 0.6* 2.0   Liver Function Tests: Recent Labs  Lab 12/12/19 1836  AST 28  ALT 25  ALKPHOS 70  BILITOT 0.5  PROT 7.7  ALBUMIN 4.7   No results for input(s): LIPASE, AMYLASE in the last 168 hours. No results for input(s): AMMONIA in the last 168 hours. CBC: Recent Labs  Lab 12/12/19 1836 12/13/19 0502  WBC 9.0 7.1  NEUTROABS 5.5  --   HGB 12.3 12.3  HCT 36.4 36.5  MCV 83.5 83.9  PLT 155 PLATELET CLUMPS NOTED ON SMEAR, UNABLE TO ESTIMATE   Cardiac Enzymes: No results for input(s): CKTOTAL, CKMB, CKMBINDEX, TROPONINI in the last 168 hours. BNP: Invalid input(s): POCBNP CBG: Recent Labs  Lab 12/12/19 1814  GLUCAP 88   D-Dimer No results for input(s): DDIMER in the last 72 hours. Hgb A1c No results for input(s): HGBA1C in the last 72 hours. Lipid Profile No results for input(s): CHOL, HDL, LDLCALC, TRIG, CHOLHDL, LDLDIRECT in the last 72 hours. Thyroid function studies No results for input(s): TSH, T4TOTAL, T3FREE, THYROIDAB in the last 72 hours.  Invalid input(s): FREET3 Anemia work up No results for input(s): VITAMINB12, FOLATE, FERRITIN, TIBC, IRON,  RETICCTPCT in the last 72 hours. Urinalysis    Component Value Date/Time   COLORURINE YELLOW 01/24/2019 2308   APPEARANCEUR CLEAR 01/24/2019 2308   LABSPEC 1.020 01/24/2019 2308   PHURINE 7.5 01/24/2019 2308   GLUCOSEU NEGATIVE 01/24/2019 2308   HGBUR NEGATIVE 01/24/2019 2308   BILIRUBINUR NEGATIVE 01/24/2019 2308   KETONESUR 15 (A) 01/24/2019 2308   PROTEINUR NEGATIVE 01/24/2019 2308   NITRITE NEGATIVE 01/24/2019 2308   LEUKOCYTESUR NEGATIVE 01/24/2019 2308   Sepsis Labs Invalid input(s): PROCALCITONIN,  WBC,  LACTICIDVEN Microbiology Recent Results (from the past 240 hour(s))  SARS Coronavirus 2 by RT PCR (hospital order, performed in [place] hospital lab) Nasopharyngeal Nasopharyngeal Swab     Status: None   Collection Time: 12/12/19  9:06 PM   Specimen: Nasopharyngeal Swab  Result Value Ref Range Status   SARS Coronavirus 2 NEGATIVE NEGATIVE Final    Comment: (NOTE) SARS-CoV-2 target nucleic acids are NOT DETECTED. The SARS-CoV-2 RNA is generally detectable in upper and lower respiratory specimens during the acute phase of infection. The lowest concentration of SARS-CoV-2 viral copies this assay can detect is 250 copies / mL. A negative result does not preclude SARS-CoV-2 infection and should not be used as the sole basis for treatment or other patient management decisions.  A negative result may occur with improper specimen collection / handling, submission of specimen other than nasopharyngeal swab, presence of viral mutation(s) within the areas targeted by this assay, and inadequate number of viral copies (<250 copies / mL). A negative result must be combined with clinical observations, patient history, and epidemiological information. Fact Sheet for Patients:   StrictlyIdeas.no Fact Sheet for Healthcare Providers: BankingDealers.co.za This test is not yet approved or cleared  by the [place] FDA and has been  authorized for detection and/or diagnosis of SARS-CoV-2 by FDA under an Emergency Use Authorization (EUA).  This EUA will remain in effect (meaning this test can be used) for the duration of the COVID-19 declaration under Section 564(b)(1) of the Act, 21 U.S.C. section 360bbb-3(b)(1), unless the authorization is terminated or revoked sooner. Performed at [facility], [address] [place], [place] [address]  Time coordinating discharge: 25  minutes  SIGNED: Antonieta Pert, MD  [facility] 12/13/2019, 10:52 AM  If 7PM-7AM, please contact night-coverage [email or web address]

## 2020-01-03 ENCOUNTER — Emergency Department (HOSPITAL_BASED_OUTPATIENT_CLINIC_OR_DEPARTMENT_OTHER): Payer: Medicare HMO

## 2020-01-03 ENCOUNTER — Emergency Department (HOSPITAL_BASED_OUTPATIENT_CLINIC_OR_DEPARTMENT_OTHER)
Admission: EM | Admit: 2020-01-03 | Discharge: 2020-01-03 | Disposition: A | Discharge status: Home / Self Care | Payer: Medicare HMO | Attending: Emergency Medicine | Admitting: Emergency Medicine

## 2020-01-03 ENCOUNTER — Encounter (HOSPITAL_BASED_OUTPATIENT_CLINIC_OR_DEPARTMENT_OTHER): Payer: Self-pay

## 2020-01-03 ENCOUNTER — Other Ambulatory Visit: Payer: Self-pay

## 2020-01-03 DIAGNOSIS — G8929 Other chronic pain: Secondary | ICD-10-CM | POA: Diagnosis not present

## 2020-01-03 DIAGNOSIS — Z880 Allergy status to penicillin: Secondary | ICD-10-CM | POA: Diagnosis not present

## 2020-01-03 DIAGNOSIS — M791 Myalgia, unspecified site: Secondary | ICD-10-CM | POA: Insufficient documentation

## 2020-01-03 DIAGNOSIS — R531 Weakness: Secondary | ICD-10-CM | POA: Insufficient documentation

## 2020-01-03 DIAGNOSIS — R079 Chest pain, unspecified: Secondary | ICD-10-CM | POA: Insufficient documentation

## 2020-01-03 DIAGNOSIS — E039 Hypothyroidism, unspecified: Secondary | ICD-10-CM | POA: Diagnosis not present

## 2020-01-03 DIAGNOSIS — R42 Dizziness and giddiness: Secondary | ICD-10-CM | POA: Diagnosis not present

## 2020-01-03 DIAGNOSIS — R0602 Shortness of breath: Secondary | ICD-10-CM | POA: Insufficient documentation

## 2020-01-03 HISTORY — DX: Other intervertebral disc degeneration, lumbar region: M51.36

## 2020-01-03 HISTORY — DX: Dizziness and giddiness: R42

## 2020-01-03 LAB — CBC
HCT: 40.7 % (ref 36.0–46.0)
Hemoglobin: 13.2 g/dL (ref 12.0–15.0)
MCH: 27.3 pg (ref 26.0–34.0)
MCHC: 32.4 g/dL (ref 30.0–36.0)
MCV: 84.3 fL (ref 80.0–100.0)
Platelets: 247 10*3/uL (ref 150–400)
RBC: 4.83 MIL/uL (ref 3.87–5.11)
RDW: 14.1 % (ref 11.5–15.5)
WBC: 8.8 10*3/uL (ref 4.0–10.5)
nRBC: 0 % (ref 0.0–0.2)

## 2020-01-03 LAB — BASIC METABOLIC PANEL
Anion gap: 10 (ref 5–15)
BUN: 13 mg/dL (ref 8–23)
CO2: 23 mmol/L (ref 22–32)
Calcium: 9.2 mg/dL (ref 8.9–10.3)
Chloride: 98 mmol/L (ref 98–111)
Creatinine, Ser: 0.81 mg/dL (ref 0.44–1.00)
GFR calc Af Amer: >60 mL/min (ref >60)
GFR calc non Af Amer: >60 mL/min (ref >60)
Glucose, Bld: 111 mg/dL — ABNORMAL HIGH (ref 70–99)
Potassium: 3.7 mmol/L (ref 3.5–5.1)
Sodium: 131 mmol/L — ABNORMAL LOW (ref 135–145)

## 2020-01-03 LAB — TROPONIN I (HIGH SENSITIVITY): Troponin I (High Sensitivity): 2 ng/L (ref <18)

## 2020-01-03 LAB — MAGNESIUM: Magnesium: 2.1 mg/dL (ref 1.7–2.4)

## 2020-01-03 NOTE — Discharge Instructions (Signed)
Continue to take your magnesium and potassium supplements.  Please return for worsening symptoms or if you pass out.  Please follow-up with your family doctor.

## 2020-01-03 NOTE — ED Triage Notes (Signed)
Pt reports she had her labs drawn on 6/3 and she was told her magnesium was low. Pt states she is having pain in the middle of her chest and it is described as sharp and aching. Pain is intermittent. Denies ShOB, diaphoresis.

## 2020-01-03 NOTE — ED Notes (Signed)
IV attempted x1 without success

## 2020-01-03 NOTE — ED Provider Notes (Signed)
[facility] EMERGENCY DEPARTMENT Provider Note   CSN: [id number] Arrival date & time: 01/03/20  2003     History Chief Complaint  Patient presents with  . Chest Pain    Shannon Farrell is a 69 y.o. female.  69 yo F with a chief complaints of feeling like her magnesium is low.  Patient states that she gets multiple symptoms when her magnesium drops she feels a bit lightheaded begins to feel like she is mildly uncomfortable.  She has chronic chest pain that she does not think is changed recently.  She got a call from her doctor about lab work that was performed last week and told her magnesium was low and so she decided to come to the ED for evaluation.  Has been admitted for low magnesium a couple times in the recent past.  States that she is on oral supplementation and denies any missed doses.  The history is provided by the patient.  Chest Pain Associated symptoms: shortness of breath and weakness   Associated symptoms: no abdominal pain, no dizziness, no fever, no headache, no nausea, no palpitations and no vomiting   Illness Severity:  Moderate Onset quality:  Gradual Duration:  2 days Timing:  Constant Progression:  Worsening Chronicity:  New Associated symptoms: chest pain, myalgias and shortness of breath   Associated symptoms: no abdominal pain, no congestion, no fever, no headaches, no nausea, no rhinorrhea, no vomiting and no wheezing        Past Medical History:  Diagnosis Date  . Anxiety   . Cancer ([place])    breast  . Degenerative disc disease, lumbar   . Irritable bowel syndrome   . Thyroid disease   . Vertigo     Patient Active Problem List   Diagnosis Date Noted  . Anxiety   . Chest discomfort 05/20/2019  . Hypokalemia 01/25/2019  . Hypomagnesemia 01/25/2019  . Hypothyroidism 01/25/2019  . Vertigo 01/24/2019    Past Surgical History:  Procedure Laterality Date  . ABDOMINAL HYSTERECTOMY     partial  . BREAST LUMPECTOMY       OB  History   No obstetric history on file.     No family history on file.  Social History   Tobacco Use  . Smoking status: Never Smoker  . Smokeless tobacco: Never Used  Vaping Use  . Vaping Use: Never used  Substance Use Topics  . Alcohol use: Never  . Drug use: Never    Home Medications Prior to Admission medications   Medication Sig Start Date End Date Taking? Authorizing Provider  acetaminophen (TYLENOL) 500 MG tablet Take 500 mg by mouth every 6 (six) hours as needed for moderate pain.    [provider]  albuterol (VENTOLIN HFA) 108 (90 Base) MCG/ACT inhaler  05/08/19   [provider]  calcium carbonate (OS-CAL - DOSED IN MG OF ELEMENTAL CALCIUM) 1250 (500 Ca) MG tablet Take 1 tablet by mouth daily with breakfast.    [provider]  Cholecalciferol (VITAMIN D) 50 MCG (2000 UT) tablet Take 4,000 Units by mouth daily.    [provider]  diazepam (VALIUM) 2 MG tablet Take 2 mg by mouth 2 (two) times daily as needed. 10/24/19   [provider]  fluticasone (FLONASE) 50 MCG/ACT nasal spray Place 2 sprays into both nostrils daily.  03/08/19   [provider]  lactose free nutrition (BOOST) LIQD Take 237 mLs by mouth 2 (two) times daily between meals.  [provider]  levocetirizine (XYZAL) 5 MG tablet Take 5 mg by mouth every evening.  05/16/19   [provider]  levothyroxine (SYNTHROID, LEVOTHROID) 75 MCG tablet Take 75 mcg by mouth daily before breakfast.    [provider]  magnesium oxide (MAG-OX) 400 MG tablet Take 1 tablet by mouth 3 (three) times daily. 12/04/19   [provider]  meclizine (ANTIVERT) 12.5 MG tablet Take 12.5 mg by mouth 3 (three) times daily as needed for dizziness.    [provider]  mirtazapine (REMERON) 15 MG tablet Take 15 mg by mouth at bedtime.    [provider]  Multiple Vitamin (MULTIVITAMIN WITH MINERALS) TABS tablet Take 1 tablet by mouth  daily.    [provider]  omeprazole (PRILOSEC) 40 MG capsule Take 40 mg by mouth daily.     [provider]  potassium chloride (KLOR-CON) 10 MEQ tablet Take 10 mEq by mouth 2 (two) times daily. 10/04/19   [provider]    Allergies    Bee venom, Codeine, Tramadol, Vicodin [hydrocodone-acetaminophen], and Penicillins  Review of Systems   Review of Systems  Constitutional: Negative for chills and fever.  HENT: Negative for congestion and rhinorrhea.   Eyes: Negative for redness and visual disturbance.  Respiratory: Positive for shortness of breath. Negative for wheezing.   Cardiovascular: Positive for chest pain. Negative for palpitations.  Gastrointestinal: Negative for abdominal pain, nausea and vomiting.  Genitourinary: Negative for dysuria and urgency.  Musculoskeletal: Positive for myalgias. Negative for arthralgias.  Skin: Negative for pallor and wound.  Neurological: Positive for weakness and light-headedness. Negative for dizziness and headaches.    Physical Exam Updated Vital Signs BP 117/85 (BP Location: Left Arm)   Pulse 82   Temp 98.8 F (37.1 C) (Oral)   Resp 20   Ht 5\' 4"  (1.626 m)   Wt 63 kg   SpO2 95%   BMI 23.86 kg/m   Physical Exam Vitals and nursing note reviewed.  Constitutional:      General: She is not in acute distress.    Appearance: She is well-developed. She is not diaphoretic.  HENT:     Head: Normocephalic and atraumatic.  Eyes:     Pupils: Pupils are equal, round, and reactive to light.  Cardiovascular:     Rate and Rhythm: Normal rate and regular rhythm.     Heart sounds: No murmur heard.  No friction rub. No gallop.   Pulmonary:     Effort: Pulmonary effort is normal.     Breath sounds: No wheezing or rales.  Abdominal:     General: There is no distension.     Palpations: Abdomen is soft.     Tenderness: There is no abdominal tenderness.  Musculoskeletal:        General: No tenderness.     Cervical  back: Normal range of motion and neck supple.  Skin:    General: Skin is warm and dry.  Neurological:     Mental Status: She is alert and oriented to person, place, and time.  Psychiatric:        Behavior: Behavior normal.     ED Results / Procedures / Treatments   Labs (all labs ordered are listed, but only abnormal results are displayed) Labs Reviewed  BASIC METABOLIC PANEL - Abnormal; Notable for the following components:      Result Value   Sodium 131 (*)    Glucose, Bld 111 (*)    All other  components within normal limits  CBC  MAGNESIUM  TROPONIN I (HIGH SENSITIVITY)  TROPONIN I (HIGH SENSITIVITY)    EKG EKG Interpretation  Date/Time:  Friday January 03 2020 20:06:48 EDT Ventricular Rate:  82 PR Interval:  164 QRS Duration: 74 QT Interval:  358 QTC Calculation: 418 R Axis:   20 Text Interpretation: Normal sinus rhythm Low voltage QRS Borderline ECG No significant change since last tracing Confirmed by Deno Etienne [phone]) on 01/03/2020 8:38:12 PM   Radiology DG Chest 2 View  Result Date: 01/03/2020 CLINICAL DATA:  69 year old female with left chest pain and dizziness for 1 week. EXAM: CHEST - 2 VIEW COMPARISON:  Chest radiographs 04/21/2019 and earlier. FINDINGS: Cardiac size is stable at the upper limits of normal, with mild tortuosity of the thoracic aorta again noted. Other mediastinal contours are within normal limits. Visualized tracheal air column is within normal limits. Stable lung volumes at the upper limits of normal to mildly hyperinflated. Chronic curvilinear atelectasis or scarring at the left lung base is stable. No pneumothorax, pulmonary edema, pleural effusion or acute pulmonary opacity. No acute osseous abnormality identified. Negative visible bowel gas pattern. IMPRESSION: Stable chest.  No acute cardiopulmonary abnormality. Electronically Signed   By: Genevie Ann M.D.   On: 01/03/2020 21:01    Procedures Procedures (including critical care  time)  Medications Ordered in ED Medications - No data to display  ED Course  I have reviewed the triage vital signs and the nursing notes.  Pertinent labs & imaging results that were available during my care of the patient were reviewed by me and considered in my medical decision making (see chart for details).    MDM Rules/Calculators/A&P                          69 yo F with a cc of feeling like her magnesium is low.  Patient has a large constellation of symptoms that have been going on for many months.  She feels like they have come and gone with her magnesium level going low.  Has been admitted a couple times for this in the recent past.  Got a call from her family doctor about lab work done about a week ago that told her that her magnesium was low and with her symptoms she decided to come to the ED.  Will obtain a laboratory evaluation.  Patient's potassium and magnesium are within the normal range.  I discussed this with her.  She will follow up with her family doctor in the office.  She last felt lightheaded yesterday.  No significant change in her chest pain in the past 6 hours.  Troponins negative.  Chest x-ray viewed by me without focal history of pneumothorax.  PCP follow-up.  10:15 PM:  I have discussed the diagnosis/risks/treatment options with the patient and believe the pt to be eligible for discharge home to follow-up with PCP. We also discussed returning to the ED immediately if new or worsening sx occur. We discussed the sx which are most concerning (e.g., sudden worsening pain, fever, inability to tolerate by mouth) that necessitate immediate return. Medications administered to the patient during their visit and any new prescriptions provided to the patient are listed below.  Medications given during this visit Medications - No data to display   The patient appears reasonably screen and/or stabilized for discharge and I doubt any other medical condition or other [facility]  requiring further screening, evaluation, or treatment in  the ED at this time prior to discharge.   Final Clinical Impression(s) / ED Diagnoses Final diagnoses:  Lightheadedness  Chronic chest pain    Rx / DC Orders ED Discharge Orders    None       Deno Etienne, DO 01/03/20 2215

## 2020-11-02 ENCOUNTER — Other Ambulatory Visit: Payer: Self-pay

## 2020-11-02 ENCOUNTER — Emergency Department (HOSPITAL_BASED_OUTPATIENT_CLINIC_OR_DEPARTMENT_OTHER)
Admission: EM | Admit: 2020-11-02 | Discharge: 2020-11-03 | Disposition: A | Discharge status: Home / Self Care | Payer: Medicare HMO | Attending: Emergency Medicine | Admitting: Emergency Medicine

## 2020-11-02 ENCOUNTER — Encounter (HOSPITAL_BASED_OUTPATIENT_CLINIC_OR_DEPARTMENT_OTHER): Payer: Self-pay | Admitting: *Deleted

## 2020-11-02 DIAGNOSIS — E039 Hypothyroidism, unspecified: Secondary | ICD-10-CM | POA: Diagnosis not present

## 2020-11-02 DIAGNOSIS — Z853 Personal history of malignant neoplasm of breast: Secondary | ICD-10-CM | POA: Insufficient documentation

## 2020-11-02 DIAGNOSIS — R109 Unspecified abdominal pain: Secondary | ICD-10-CM | POA: Diagnosis not present

## 2020-11-02 DIAGNOSIS — Z79899 Other long term (current) drug therapy: Secondary | ICD-10-CM | POA: Insufficient documentation

## 2020-11-02 DIAGNOSIS — M546 Pain in thoracic spine: Secondary | ICD-10-CM | POA: Insufficient documentation

## 2020-11-02 NOTE — ED Triage Notes (Signed)
C/o right flank/rib pain x 12 hrs , increased pain with movt

## 2020-11-02 NOTE — ED Notes (Signed)
States has had left flank/ back pain for three weeks.  Seen MD for same then and again yesterday as not getting better.  States pain has moved to radiating to left lower abdominal then over to right side flank.  States worse tonight therefore came here.  Worse with movement.  States was to have lab work done today but they where unsuccessful in drawing labs.

## 2020-11-02 NOTE — ED Provider Notes (Signed)
[facility] EMERGENCY DEPARTMENT Provider Note  CSN: [id number] Arrival date & time: 11/02/20 2249  Chief Complaint(s) Flank Pain  HPI Shannon Farrell is a 70 y.o. female with a past medical history listed below who presents to the emergency department with several days of flank discomfort.  She reports that she picked up a box of books at work earlier in the week and believes she pulled a muscle.  Pain began on left flank, radiated across her abdomen.  Patient saw her primary care provider who prescribed her tizanidine.  Her pain on the left side has resolved but today she began having right flank pain.  His pain is aching in nature and worse with movement and palpation.  She denies any chest pain or shortness of breath.  No abdominal pain.  No urinary symptoms.  No nausea or vomiting.  No diarrhea.  No other physical complaints.  HPI  Past Medical History Past Medical History:  Diagnosis Date  . Anxiety   . Cancer ([place])    breast  . Degenerative disc disease, lumbar   . Irritable bowel syndrome   . Thyroid disease   . Vertigo    Patient Active Problem List   Diagnosis Date Noted  . Anxiety   . Chest discomfort 05/20/2019  . Hypokalemia 01/25/2019  . Hypomagnesemia 01/25/2019  . Hypothyroidism 01/25/2019  . Vertigo 01/24/2019   Home Medication(s) Prior to Admission medications   Medication Sig Start Date End Date Taking? Authorizing Provider  acetaminophen (TYLENOL) 500 MG tablet Take 500 mg by mouth every 6 (six) hours as needed for moderate pain.    [provider]  albuterol (VENTOLIN HFA) 108 (90 Base) MCG/ACT inhaler  05/08/19   [provider]  calcium carbonate (OS-CAL - DOSED IN MG OF ELEMENTAL CALCIUM) 1250 (500 Ca) MG tablet Take 1 tablet by mouth daily with breakfast.    [provider]  Cholecalciferol (VITAMIN D) 50 MCG (2000 UT) tablet Take 4,000 Units by mouth daily.    [provider]  diazepam (VALIUM) 2 MG tablet  Take 2 mg by mouth 2 (two) times daily as needed. 10/24/19   [provider]  fluticasone (FLONASE) 50 MCG/ACT nasal spray Place 2 sprays into both nostrils daily.  03/08/19   [provider]  lactose free nutrition (BOOST) LIQD Take 237 mLs by mouth 2 (two) times daily between meals.    [provider]  levocetirizine (XYZAL) 5 MG tablet Take 5 mg by mouth every evening.  05/16/19   [provider]  levothyroxine (SYNTHROID, LEVOTHROID) 75 MCG tablet Take 75 mcg by mouth daily before breakfast.    [provider]  magnesium oxide (MAG-OX) 400 MG tablet Take 1 tablet by mouth 3 (three) times daily. 12/04/19   [provider]  meclizine (ANTIVERT) 12.5 MG tablet Take 12.5 mg by mouth 3 (three) times daily as needed for dizziness.    [provider]  mirtazapine (REMERON) 15 MG tablet Take 15 mg by mouth at bedtime.    [provider]  Multiple Vitamin (MULTIVITAMIN WITH MINERALS) TABS tablet Take 1 tablet by mouth daily.    [provider]  omeprazole (PRILOSEC) 40 MG capsule Take 40 mg by mouth daily.     [provider]  potassium chloride (KLOR-CON) 10 MEQ tablet Take 10 mEq by mouth 2 (two) times daily. 10/04/19   [provider]  tiZANidine (ZANAFLEX) 2 MG tablet Take 2 mg by mouth every 8 (eight) hours  as needed. 10/18/20   [provider]                                                                                                                                    Past Surgical History Past Surgical History:  Procedure Laterality Date  . ABDOMINAL HYSTERECTOMY     partial  . BREAST LUMPECTOMY     Family History No family history on file.  Social History Social History   Tobacco Use  . Smoking status: Never Smoker  . Smokeless tobacco: Never Used  Vaping Use  . Vaping Use: Never used  Substance Use Topics  . Alcohol use: Never  . Drug use: Never   Allergies Bee venom,  Codeine, Tramadol, Vicodin [hydrocodone-acetaminophen], and Penicillins  Review of Systems Review of Systems All other systems are reviewed and are negative for acute change except as noted in the HPI  Physical Exam Vital Signs  I have reviewed the triage vital signs BP 110/77   Pulse 67   Temp 98 F (36.7 C) (Oral)   Resp 14   Ht 5' 4.5" (1.638 m)   Wt 60.3 kg   SpO2 96%   BMI 22.48 kg/m   Physical Exam Vitals reviewed.  Constitutional:      General: She is not in acute distress.    Appearance: She is well-developed. She is not diaphoretic.  HENT:     Head: Normocephalic and atraumatic.     Nose: Nose normal.  Eyes:     General: No scleral icterus.       Right eye: No discharge.        Left eye: No discharge.     Conjunctiva/sclera: Conjunctivae normal.     Pupils: Pupils are equal, round, and reactive to light.  Cardiovascular:     Rate and Rhythm: Normal rate and regular rhythm.     Heart sounds: No murmur heard. No friction rub. No gallop.   Pulmonary:     Effort: Pulmonary effort is normal. No respiratory distress.     Breath sounds: Normal breath sounds. No stridor. No rales.  Abdominal:     General: There is no distension.     Palpations: Abdomen is soft.     Tenderness: There is no abdominal tenderness.  Musculoskeletal:     Cervical back: Normal range of motion and neck supple.     Thoracic back: Spasms and tenderness present. No bony tenderness.       Back:  Skin:    General: Skin is warm and dry.     Findings: No erythema or rash.  Neurological:     Mental Status: She is alert and oriented to person, place, and time.     ED Results and Treatments Labs (all labs ordered are listed, but only abnormal results are displayed) Labs Reviewed  COMPREHENSIVE METABOLIC PANEL - Abnormal; Notable for the following components:  Result Value   Sodium 131 (*)    Chloride 96 (*)    All other components within normal limits  URINALYSIS, ROUTINE W  REFLEX MICROSCOPIC - Abnormal; Notable for the following components:   Specific Gravity, Urine <1.005 (*)    Hgb urine dipstick TRACE (*)    Leukocytes,Ua SMALL (*)    All other components within normal limits  CBC WITH DIFFERENTIAL/PLATELET  LIPASE, BLOOD  MAGNESIUM  URINALYSIS, MICROSCOPIC (REFLEX)                                                                                                                         EKG  EKG Interpretation  Date/Time:    Ventricular Rate:    PR Interval:    QRS Duration:   QT Interval:    QTC Calculation:   R Axis:     Text Interpretation:        Radiology DG ABD ACUTE 2+V W 1V CHEST  Result Date: 11/03/2020 CLINICAL DATA:  Bilateral flank pain, back pain EXAM: DG ABDOMEN ACUTE WITH 1 VIEW CHEST COMPARISON:  None. FINDINGS: Lungs are clear. No pneumothorax or pleural effusion. Cardiac size within normal limits. Pulmonary vascularity is normal. Normal abdominal gas pattern. No free intraperitoneal gas. No organomegaly. Ovoid calcific density noted within the left mid abdomen appears external to the left renal silhouette and likely represents a tablet within the enteric lumen. Multiple phleboliths noted within the right hemipelvis. No acute bone abnormality. IMPRESSION: Negative abdominal radiographs.  No acute cardiopulmonary disease. Electronically Signed   By: Fidela Salisbury MD   On: 11/03/2020 00:51    Pertinent labs & imaging results that were available during my care of the patient were reviewed by me and considered in my medical decision making (see chart for details).  Medications Ordered in ED Medications  fentaNYL (SUBLIMAZE) injection 12.5 mcg (12.5 mcg Intravenous Given 11/03/20 0021)  ondansetron (ZOFRAN) injection 4 mg (4 mg Intravenous Given 11/03/20 0055)  sodium chloride 0.9 % bolus 1,000 mL ( Intravenous Stopped 11/03/20 0301)  ketorolac (TORADOL) 15 MG/ML injection 7.5 mg (7.5 mg Intravenous Given 11/03/20 0500)                                                                                                                                     Procedures Procedures  (including critical care time)  Medical Decision Making / ED Course I  have reviewed the nursing notes for this encounter and the patient's prior records (if available in EHR or on provided paperwork).   Shannon Farrell was evaluated in Emergency Department on 11/03/2020 for the symptoms described in the history of present illness. She was evaluated in the context of the global COVID-19 pandemic, which necessitated consideration that the patient might be at risk for infection with the SARS-CoV-2 virus that causes COVID-19. Institutional protocols and algorithms that pertain to the evaluation of patients at risk for COVID-19 are in a state of rapid change based on information released by regulatory bodies including the CDC and federal and state organizations. These policies and algorithms were followed during the patient's care in the ED.  Right flank/thoracic back pain. Worse with movement and palpation.. Lungs clear to auscultation bilaterally. Abdomen benign.  Likely muscular in nature.  Acute abdominal series ruled out right lower lobe pneumonia.  No evidence of bowel obstruction. Labs without leukocytosis or anemia.  No significant electrolyte derangements or renal sufficiency.  No biliary obstruction or pancreatitis. UA with mild hematuria, without evidence of infection.  Treated symptomatically.      Final Clinical Impression(s) / ED Diagnoses Final diagnoses:  Right flank pain    The patient appears reasonably screened and/or stabilized for discharge and I doubt any other medical condition or other [facility] requiring further screening, evaluation, or treatment in the ED at this time prior to discharge. Safe for discharge with strict return precautions.  Disposition: Discharge  Condition: Good  I have discussed the results, Dx and Tx plan with  the patient/family who expressed understanding and agree(s) with the plan. Discharge instructions discussed at length. The patient/family was given strict return precautions who verbalized understanding of the instructions. No further questions at time of discharge.    ED Discharge Orders    None       Follow Up: Glendon Axe, MD [address] [place] [address] [phone]  Call  if symptoms do not improve or  worsen     This chart was dictated using voice recognition software.  Despite best efforts to proofread,  errors can occur which can change the documentation meaning.   Fatima Blank, MD 11/03/20 [phone]

## 2020-11-03 ENCOUNTER — Emergency Department (HOSPITAL_BASED_OUTPATIENT_CLINIC_OR_DEPARTMENT_OTHER): Payer: Medicare HMO

## 2020-11-03 LAB — COMPREHENSIVE METABOLIC PANEL
ALT: 22 U/L (ref 0–44)
AST: 22 U/L (ref 15–41)
Albumin: 4.3 g/dL (ref 3.5–5.0)
Alkaline Phosphatase: 80 U/L (ref 38–126)
Anion gap: 10 (ref 5–15)
BUN: 17 mg/dL (ref 8–23)
CO2: 25 mmol/L (ref 22–32)
Calcium: 9.5 mg/dL (ref 8.9–10.3)
Chloride: 96 mmol/L — ABNORMAL LOW (ref 98–111)
Creatinine, Ser: 0.65 mg/dL (ref 0.44–1.00)
GFR, Estimated: >60 mL/min (ref >60)
Glucose, Bld: 97 mg/dL (ref 70–99)
Potassium: 3.7 mmol/L (ref 3.5–5.1)
Sodium: 131 mmol/L — ABNORMAL LOW (ref 135–145)
Total Bilirubin: 0.3 mg/dL (ref 0.3–1.2)
Total Protein: 7.4 g/dL (ref 6.5–8.1)

## 2020-11-03 LAB — CBC WITH DIFFERENTIAL/PLATELET
Abs Immature Granulocytes: 0.02 10*3/uL (ref 0.00–0.07)
Basophils Absolute: 0.1 10*3/uL (ref 0.0–0.1)
Basophils Relative: 1 %
Eosinophils Absolute: 0.1 10*3/uL (ref 0.0–0.5)
Eosinophils Relative: 1 %
HCT: 39.6 % (ref 36.0–46.0)
Hemoglobin: 13.5 g/dL (ref 12.0–15.0)
Immature Granulocytes: 0 %
Lymphocytes Relative: 37 %
Lymphs Abs: 3.4 10*3/uL (ref 0.7–4.0)
MCH: 28.4 pg (ref 26.0–34.0)
MCHC: 34.1 g/dL (ref 30.0–36.0)
MCV: 83.4 fL (ref 80.0–100.0)
Monocytes Absolute: 0.7 10*3/uL (ref 0.1–1.0)
Monocytes Relative: 8 %
Neutro Abs: 4.9 10*3/uL (ref 1.7–7.7)
Neutrophils Relative %: 53 %
Platelets: 273 10*3/uL (ref 150–400)
RBC: 4.75 MIL/uL (ref 3.87–5.11)
RDW: 14.5 % (ref 11.5–15.5)
WBC: 9.1 10*3/uL (ref 4.0–10.5)
nRBC: 0 % (ref 0.0–0.2)

## 2020-11-03 LAB — URINALYSIS, ROUTINE W REFLEX MICROSCOPIC
Bilirubin Urine: NEGATIVE
Glucose, UA: NEGATIVE mg/dL
Ketones, ur: NEGATIVE mg/dL
Nitrite: NEGATIVE
Protein, ur: NEGATIVE mg/dL
Specific Gravity, Urine: <1.005 — ABNORMAL LOW (ref 1.005–1.030)
pH: 5.5 (ref 5.0–8.0)

## 2020-11-03 LAB — MAGNESIUM: Magnesium: 2 mg/dL (ref 1.7–2.4)

## 2020-11-03 LAB — URINALYSIS, MICROSCOPIC (REFLEX): Bacteria, UA: NONE SEEN

## 2020-11-03 LAB — LIPASE, BLOOD: Lipase: 33 U/L (ref 11–51)

## 2020-11-03 MED ORDER — KETOROLAC TROMETHAMINE 15 MG/ML IJ SOLN
7.5000 mg | Freq: Once | INTRAMUSCULAR | Status: AC
  Administered 2020-11-03: 7.5 mg via INTRAVENOUS
  Filled 2020-11-03: qty 1

## 2020-11-03 MED ORDER — FENTANYL CITRATE (PF) 100 MCG/2ML IJ SOLN
12.5000 ug | Freq: Once | INTRAMUSCULAR | Status: AC
Start: 2020-11-03 — End: 2020-11-03
  Administered 2020-11-03: 12.5 ug via INTRAVENOUS
  Filled 2020-11-03: qty 2

## 2020-11-03 MED ORDER — SODIUM CHLORIDE 0.9 % IV BOLUS
1000.0000 mL | Freq: Once | INTRAVENOUS | Status: AC
  Administered 2020-11-03: 1000 mL via INTRAVENOUS

## 2020-11-03 MED ORDER — ONDANSETRON HCL 4 MG/2ML IJ SOLN
4.0000 mg | Freq: Once | INTRAMUSCULAR | Status: AC
  Administered 2020-11-03: 4 mg via INTRAVENOUS
  Filled 2020-11-03: qty 2

## 2020-11-03 NOTE — ED Notes (Signed)
Attempted to obtain vitals but pt is at xray. Will get vitals when pt returns.

## 2020-11-03 NOTE — ED Notes (Signed)
Patient transported to X-ray 

## 2020-11-03 NOTE — ED Notes (Signed)
Return from xray  Vomiting small amount.

## 2020-11-03 NOTE — Discharge Instructions (Addendum)
You may use over-the-counter Motrin (Ibuprofen), Acetaminophen (Tylenol), topical muscle creams such as SalonPas, Icy Hot, Bengay, etc. Please stretch, apply ice or heat (whichever helps), and have massage therapy for additional assistance.  

## 2020-11-15 ENCOUNTER — Emergency Department (HOSPITAL_BASED_OUTPATIENT_CLINIC_OR_DEPARTMENT_OTHER)
Admission: EM | Admit: 2020-11-15 | Discharge: 2020-11-15 | Disposition: A | Discharge status: Home / Self Care | Payer: Medicare HMO | Attending: Emergency Medicine | Admitting: Emergency Medicine

## 2020-11-15 ENCOUNTER — Emergency Department (HOSPITAL_BASED_OUTPATIENT_CLINIC_OR_DEPARTMENT_OTHER): Payer: Medicare HMO

## 2020-11-15 ENCOUNTER — Other Ambulatory Visit: Payer: Self-pay

## 2020-11-15 DIAGNOSIS — Z853 Personal history of malignant neoplasm of breast: Secondary | ICD-10-CM | POA: Insufficient documentation

## 2020-11-15 DIAGNOSIS — R0789 Other chest pain: Secondary | ICD-10-CM | POA: Insufficient documentation

## 2020-11-15 DIAGNOSIS — R1011 Right upper quadrant pain: Secondary | ICD-10-CM | POA: Diagnosis not present

## 2020-11-15 DIAGNOSIS — E039 Hypothyroidism, unspecified: Secondary | ICD-10-CM | POA: Diagnosis not present

## 2020-11-15 DIAGNOSIS — Z79899 Other long term (current) drug therapy: Secondary | ICD-10-CM | POA: Insufficient documentation

## 2020-11-15 LAB — COMPREHENSIVE METABOLIC PANEL
ALT: 21 U/L (ref 0–44)
AST: 19 U/L (ref 15–41)
Albumin: 4.4 g/dL (ref 3.5–5.0)
Alkaline Phosphatase: 72 U/L (ref 38–126)
Anion gap: 10 (ref 5–15)
BUN: 12 mg/dL (ref 8–23)
CO2: 22 mmol/L (ref 22–32)
Calcium: 9.2 mg/dL (ref 8.9–10.3)
Chloride: 94 mmol/L — ABNORMAL LOW (ref 98–111)
Creatinine, Ser: 0.6 mg/dL (ref 0.44–1.00)
GFR, Estimated: >60 mL/min (ref >60)
Glucose, Bld: 93 mg/dL (ref 70–99)
Potassium: 4 mmol/L (ref 3.5–5.1)
Sodium: 126 mmol/L — ABNORMAL LOW (ref 135–145)
Total Bilirubin: 0.4 mg/dL (ref 0.3–1.2)
Total Protein: 7.3 g/dL (ref 6.5–8.1)

## 2020-11-15 LAB — URINALYSIS, ROUTINE W REFLEX MICROSCOPIC
Bilirubin Urine: NEGATIVE
Glucose, UA: NEGATIVE mg/dL
Glucose, UA: NEGATIVE mg/dL
Hgb urine dipstick: NEGATIVE
Ketones, ur: NEGATIVE mg/dL
Ketones, ur: NEGATIVE mg/dL
Leukocytes,Ua: NEGATIVE
Nitrite: NEGATIVE
Nitrite: NEGATIVE
Protein, ur: 100 mg/dL — AB
Protein, ur: NEGATIVE mg/dL
Specific Gravity, Urine: >1.03 — ABNORMAL HIGH (ref 1.005–1.030)
Specific Gravity, Urine: <1.005 — ABNORMAL LOW (ref 1.005–1.030)
pH: 5 (ref 5.0–8.0)
pH: 5.5 (ref 5.0–8.0)

## 2020-11-15 LAB — CBC WITH DIFFERENTIAL/PLATELET
Abs Immature Granulocytes: 0.03 10*3/uL (ref 0.00–0.07)
Basophils Absolute: 0 10*3/uL (ref 0.0–0.1)
Basophils Relative: 1 %
Eosinophils Absolute: 0.1 10*3/uL (ref 0.0–0.5)
Eosinophils Relative: 1 %
HCT: 38.4 % (ref 36.0–46.0)
Hemoglobin: 12.9 g/dL (ref 12.0–15.0)
Immature Granulocytes: 0 %
Lymphocytes Relative: 35 %
Lymphs Abs: 3 10*3/uL (ref 0.7–4.0)
MCH: 28.3 pg (ref 26.0–34.0)
MCHC: 33.6 g/dL (ref 30.0–36.0)
MCV: 84.2 fL (ref 80.0–100.0)
Monocytes Absolute: 0.6 10*3/uL (ref 0.1–1.0)
Monocytes Relative: 7 %
Neutro Abs: 4.8 10*3/uL (ref 1.7–7.7)
Neutrophils Relative %: 56 %
Platelets: 313 10*3/uL (ref 150–400)
RBC: 4.56 MIL/uL (ref 3.87–5.11)
RDW: 14.6 % (ref 11.5–15.5)
WBC: 8.4 10*3/uL (ref 4.0–10.5)
nRBC: 0 % (ref 0.0–0.2)

## 2020-11-15 LAB — URINALYSIS, MICROSCOPIC (REFLEX)

## 2020-11-15 LAB — TROPONIN I (HIGH SENSITIVITY)
Troponin I (High Sensitivity): <2 ng/L (ref <18)
Troponin I (High Sensitivity): <2 ng/L (ref <18)

## 2020-11-15 LAB — D-DIMER, QUANTITATIVE: D-Dimer, Quant: 0.34 ug/mL-FEU (ref 0.00–0.50)

## 2020-11-15 LAB — LIPASE, BLOOD: Lipase: 29 U/L (ref 11–51)

## 2020-11-15 MED ORDER — LIDOCAINE 5 % EX PTCH: 1.0000 | MEDICATED_PATCH | CUTANEOUS | 0 refills | Status: AC

## 2020-11-15 MED ORDER — NAPROXEN 500 MG PO TABS: 500.0000 mg | ORAL_TABLET | Freq: Two times a day (BID) | ORAL | 0 refills | Status: AC

## 2020-11-15 MED ORDER — OXYCODONE HCL 5 MG PO TABS: 5.0000 mg | ORAL_TABLET | Freq: Four times a day (QID) | ORAL | 0 refills | Status: DC | PRN

## 2020-11-15 MED ORDER — SODIUM CHLORIDE 0.9 % IV BOLUS
500.0000 mL | Freq: Once | INTRAVENOUS | Status: AC
  Administered 2020-11-15: 500 mL via INTRAVENOUS

## 2020-11-15 MED ORDER — IOHEXOL 350 MG/ML SOLN
100.0000 mL | Freq: Once | INTRAVENOUS | Status: AC
  Administered 2020-11-15: 100 mL via INTRAVENOUS

## 2020-11-15 MED ORDER — LIDOCAINE 5 % EX PTCH: 1.0000 | MEDICATED_PATCH | CUTANEOUS | 0 refills | Status: DC

## 2020-11-15 MED ORDER — ONDANSETRON 4 MG PO TBDP
8.0000 mg | ORAL_TABLET | Freq: Once | ORAL | Status: AC
  Administered 2020-11-15: 8 mg via ORAL
  Filled 2020-11-15: qty 2

## 2020-11-15 MED ORDER — OXYCODONE HCL 5 MG PO TABS: 5.0000 mg | ORAL_TABLET | Freq: Four times a day (QID) | ORAL | 0 refills | Status: AC | PRN

## 2020-11-15 MED ORDER — FENTANYL CITRATE (PF) 100 MCG/2ML IJ SOLN
50.0000 ug | Freq: Once | INTRAMUSCULAR | Status: AC
Start: 2020-11-15 — End: 2020-11-15
  Administered 2020-11-15: 50 ug via INTRAVENOUS
  Filled 2020-11-15: qty 2

## 2020-11-15 MED ORDER — ONDANSETRON 4 MG PO TBDP: 4.0000 mg | ORAL_TABLET | Freq: Three times a day (TID) | ORAL | 0 refills | Status: AC | PRN

## 2020-11-15 MED ORDER — ONDANSETRON HCL 4 MG/2ML IJ SOLN
4.0000 mg | Freq: Once | INTRAMUSCULAR | Status: AC
  Administered 2020-11-15: 4 mg via INTRAVENOUS
  Filled 2020-11-15: qty 2

## 2020-11-15 MED ORDER — FENTANYL CITRATE (PF) 100 MCG/2ML IJ SOLN
50.0000 ug | Freq: Once | INTRAMUSCULAR | Status: AC
  Administered 2020-11-15: 50 ug via INTRAVENOUS
  Filled 2020-11-15: qty 2

## 2020-11-15 MED ORDER — NAPROXEN 500 MG PO TABS: 500.0000 mg | ORAL_TABLET | Freq: Two times a day (BID) | ORAL | 0 refills | Status: DC

## 2020-11-15 MED ORDER — ONDANSETRON 4 MG PO TBDP: 4.0000 mg | ORAL_TABLET | Freq: Three times a day (TID) | ORAL | 0 refills | Status: DC | PRN

## 2020-11-15 MED ORDER — ONDANSETRON HCL 4 MG/2ML IJ SOLN
4.0000 mg | Freq: Once | INTRAMUSCULAR | Status: DC
  Filled 2020-11-15: qty 2

## 2020-11-15 NOTE — ED Triage Notes (Signed)
RUQ pain radiating to back x 200 weeks,

## 2020-11-15 NOTE — ED Notes (Addendum)
Patient c/o dizziness and feeling "whoozy" upon sitting up from bed.  Pt called friend for ride home and will wait in waiting room for ride.  This RN witnessed conversation on phone with patient and her ride and ride states he is on his way to come get her.  Pt verbalized understanding and agrees to not drive herself home and wait in waiting room for friend.  Patient assisted to waiting area by wheelchair and given crackers and water to drink.

## 2020-11-15 NOTE — ED Provider Notes (Signed)
[facility] EMERGENCY DEPARTMENT Provider Note   CSN: [id number] Arrival date & time: 11/15/20  1508     History Chief Complaint  Patient presents with  . Abdominal Pain    Shannon Farrell is a 70 y.o. female with past medical history significant for vertigo who presents for evaluation of RUQ, right lower chest pain. Radiates into flank. Seen here 11/02/20 for similar complaints.  Initially stated the pain started on left side and radiated across her abdomen.  This began after picking up a box at work.  The pain in the left side of her abdomen resolved however still the right side of her abdomen.  Pain is constant and worse with movement.  Worse when she takes a deep breath.  Will occasionally radiate into her right scapula.  Pain is not worse with food intake.  Has been seen by her PCP twice for this.  Has been prescribed 2 different muscle relaxers without relief.  She denies any prior history of AAA or dissection.  No left-sided chest pain.  Symptoms do not radiate into the left neck, arm.  No headache, lightheadedness, dizziness, dysuria, hematuria, paresthesias or weakness. Denies additional aggravating or alleviating factors  History obtained from patient and past medical records.  No interpreter used  HPI     Past Medical History:  Diagnosis Date  . Anxiety   . Cancer ([place])    breast  . Degenerative disc disease, lumbar   . Irritable bowel syndrome   . Thyroid disease   . Vertigo     Patient Active Problem List   Diagnosis Date Noted  . Anxiety   . Chest discomfort 05/20/2019  . Hypokalemia 01/25/2019  . Hypomagnesemia 01/25/2019  . Hypothyroidism 01/25/2019  . Vertigo 01/24/2019    Past Surgical History:  Procedure Laterality Date  . ABDOMINAL HYSTERECTOMY     partial  . BREAST LUMPECTOMY       OB History   No obstetric history on file.     No family history on file.  Social History   Tobacco Use  . Smoking status: Never Smoker  .  Smokeless tobacco: Never Used  Vaping Use  . Vaping Use: Never used  Substance Use Topics  . Alcohol use: Never  . Drug use: Never    Home Medications Prior to Admission medications   Medication Sig Start Date End Date Taking? Authorizing Provider  lidocaine (LIDODERM) 5 % Place 1 patch onto the skin daily. Remove & Discard patch within 12 hours or as directed by MD 11/15/20  Yes Emmalea Treanor A, PA-C  naproxen (NAPROSYN) 500 MG tablet Take 1 tablet (500 mg total) by mouth 2 (two) times daily. 11/15/20  Yes Shinita Mac A, PA-C  ondansetron (ZOFRAN ODT) 4 MG disintegrating tablet Take 1 tablet (4 mg total) by mouth every 8 (eight) hours as needed for nausea or vomiting. 11/15/20  Yes Hever Castilleja A, PA-C  oxyCODONE (ROXICODONE) 5 MG immediate release tablet Take 1 tablet (5 mg total) by mouth every 6 (six) hours as needed for severe pain. 11/15/20  Yes Becki Mccaskill A, PA-C  acetaminophen (TYLENOL) 500 MG tablet Take 500 mg by mouth every 6 (six) hours as needed for moderate pain.    [provider]  albuterol (VENTOLIN HFA) 108 (90 Base) MCG/ACT inhaler  05/08/19   [provider]  calcium carbonate (OS-CAL - DOSED IN MG OF ELEMENTAL CALCIUM) 1250 (500 Ca) MG tablet Take 1 tablet by mouth daily with breakfast.  [provider]  Cholecalciferol (VITAMIN D) 50 MCG (2000 UT) tablet Take 4,000 Units by mouth daily.    [provider]  diazepam (VALIUM) 2 MG tablet Take 2 mg by mouth 2 (two) times daily as needed. 10/24/19   [provider]  fluticasone (FLONASE) 50 MCG/ACT nasal spray Place 2 sprays into both nostrils daily.  03/08/19   [provider]  lactose free nutrition (BOOST) LIQD Take 237 mLs by mouth 2 (two) times daily between meals.    [provider]  levocetirizine (XYZAL) 5 MG tablet Take 5 mg by mouth every evening.  05/16/19   [provider]  levothyroxine (SYNTHROID, LEVOTHROID) 75 MCG tablet Take  75 mcg by mouth daily before breakfast.    [provider]  magnesium oxide (MAG-OX) 400 MG tablet Take 1 tablet by mouth 3 (three) times daily. 12/04/19   [provider]  meclizine (ANTIVERT) 12.5 MG tablet Take 12.5 mg by mouth 3 (three) times daily as needed for dizziness.    [provider]  mirtazapine (REMERON) 15 MG tablet Take 15 mg by mouth at bedtime.    [provider]  Multiple Vitamin (MULTIVITAMIN WITH MINERALS) TABS tablet Take 1 tablet by mouth daily.    [provider]  omeprazole (PRILOSEC) 40 MG capsule Take 40 mg by mouth daily.     [provider]  potassium chloride (KLOR-CON) 10 MEQ tablet Take 10 mEq by mouth 2 (two) times daily. 10/04/19   [provider]  tiZANidine (ZANAFLEX) 2 MG tablet Take 2 mg by mouth every 8 (eight) hours as needed. 10/18/20   [provider]    Allergies    Bee venom, Codeine, Tramadol, Vicodin [hydrocodone-acetaminophen], and Penicillins  Review of Systems   Review of Systems  Constitutional: Negative.   HENT: Negative.   Respiratory: Negative.   Cardiovascular: Positive for chest pain.  Gastrointestinal: Positive for abdominal pain. Negative for abdominal distention, anal bleeding, constipation, diarrhea, nausea, rectal pain and vomiting.  Genitourinary: Positive for flank pain. Negative for difficulty urinating, dysuria and frequency.  Musculoskeletal: Negative for back pain, gait problem, neck pain and neck stiffness.  Skin: Negative.   Neurological: Negative.   All other systems reviewed and are negative.   Physical Exam Updated Vital Signs BP 128/80   Pulse 70   Temp 98.2 F (36.8 C) (Oral)   Resp 15   Ht 5\' 4"  (1.626 m)   Wt 59.4 kg   SpO2 97%   BMI 22.49 kg/m   Physical Exam Vitals and nursing note reviewed.  Constitutional:      General: She is not in acute distress.    Appearance: She is well-developed. She is not ill-appearing, toxic-appearing  or diaphoretic.  HENT:     Head: Normocephalic and atraumatic.     Mouth/Throat:     Mouth: Mucous membranes are moist.  Eyes:     Pupils: Pupils are equal, round, and reactive to light.  Cardiovascular:     Rate and Rhythm: Normal rate.     Heart sounds: Normal heart sounds.  Pulmonary:     Effort: Pulmonary effort is normal. No respiratory distress.     Breath sounds: Normal breath sounds.     Comments: Clear to auscultation bilaterally.  Speaks in full sentences without difficulty Chest:       Comments: Diffuse tenderness to right lower ribs.  No crepitus or step-off Abdominal:     General: Bowel sounds are normal. There is no  distension.     Palpations: Abdomen is soft.     Tenderness: There is abdominal tenderness in the right upper quadrant. There is no right CVA tenderness, left CVA tenderness, guarding or rebound.     Hernia: No hernia is present.       Comments: Tenderness to right upper abdomen, right rib.  No midline pulsatile abdominal mass.  Musculoskeletal:        General: Normal range of motion.     Cervical back: Normal range of motion.     Comments: No midline tenderness. No bony tenderness to extremities  Skin:    General: Skin is warm and dry.     Capillary Refill: Capillary refill takes less than 2 seconds.     Comments: No rashes or lesions.  Neurological:     General: No focal deficit present.     Mental Status: She is alert.     Sensory: Sensation is intact.     Motor: Motor function is intact.     Gait: Gait is intact.     Comments: CN 2-12 grossly intact Ambulatory without difficulty    ED Results / Procedures / Treatments   Labs (all labs ordered are listed, but only abnormal results are displayed) Labs Reviewed  COMPREHENSIVE METABOLIC PANEL - Abnormal; Notable for the following components:      Result Value   Sodium 126 (*)    Chloride 94 (*)    All other components within normal limits  URINALYSIS, ROUTINE W REFLEX MICROSCOPIC -  Abnormal; Notable for the following components:   APPearance CLOUDY (*)    Specific Gravity, Urine >1.030 (*)    Bilirubin Urine SMALL (*)    Protein, ur 100 (*)    All other components within normal limits  URINALYSIS, MICROSCOPIC (REFLEX) - Abnormal; Notable for the following components:   Bacteria, UA MANY (*)    All other components within normal limits  URINALYSIS, ROUTINE W REFLEX MICROSCOPIC - Abnormal; Notable for the following components:   Color, Urine STRAW (*)    Specific Gravity, Urine <1.005 (*)    Hgb urine dipstick TRACE (*)    Leukocytes,Ua SMALL (*)    All other components within normal limits  URINALYSIS, MICROSCOPIC (REFLEX) - Abnormal; Notable for the following components:   Bacteria, UA FEW (*)    All other components within normal limits  CBC WITH DIFFERENTIAL/PLATELET  LIPASE, BLOOD  D-DIMER, QUANTITATIVE  TROPONIN I (HIGH SENSITIVITY)  TROPONIN I (HIGH SENSITIVITY)    EKG EKG Interpretation  Date/Time:  Sunday November 15 2020 16:41:40 EDT Ventricular Rate:  74 PR Interval:  177 QRS Duration: 87 QT Interval:  387 QTC Calculation: 430 R Axis:   55 Text Interpretation: Sinus rhythm Low voltage, extremity leads No significant change since last tracing Confirmed by Theotis Burrow [phone]) on 11/15/2020 6:02:42 PM   Radiology DG Chest Portable 1 View  Result Date: 11/15/2020 CLINICAL DATA:  Chest pain for 1 week EXAM: PORTABLE CHEST 1 VIEW COMPARISON:  Chest radiograph dated 01/03/2020 FINDINGS: The heart size and mediastinal contours are within normal limits. Both lungs are clear. The visualized skeletal structures are unremarkable. IMPRESSION: No active disease. Electronically Signed   By: Zerita Boers M.D.   On: 11/15/2020 17:28   CT Angio Chest/Abd/Pel for Dissection W and/or W/WO  Result Date: 11/15/2020 CLINICAL DATA:  Right upper quadrant pain radiating to the back x2 weeks, concern for aortic dissection. EXAM: CT ANGIOGRAPHY CHEST, ABDOMEN AND  PELVIS TECHNIQUE: Non-contrast CT of  the chest was initially obtained. Multidetector CT imaging through the chest, abdomen and pelvis was performed using the standard protocol during bolus administration of intravenous contrast. Multiplanar reconstructed images and MIPs were obtained and reviewed to evaluate the vascular anatomy. CONTRAST:  164mL OMNIPAQUE IOHEXOL 350 MG/ML SOLN COMPARISON:  CT chest January 26, 2019 and CT abdomen and pelvis February 07, 2018. FINDINGS: CTA CHEST FINDINGS Cardiovascular: No evidence of intramural hematoma on precontrast imaging. Preferential opacification of the thoracic aorta. No evidence of thoracic aortic aneurysm or dissection. Normal heart size. No pericardial effusion. Mediastinum/Nodes: No enlarged mediastinal, hilar, or axillary lymph nodes. Thyroid gland, trachea, and esophagus demonstrate no significant findings. Lungs/Pleura: Bibasilar atelectasis. No focal consolidation. No pleural effusion. No pneumothorax. Musculoskeletal: No chest wall abnormality. No acute or significant osseous findings. Review of the MIP images confirms the above findings. CTA ABDOMEN AND PELVIS FINDINGS VASCULAR Aorta: Normal caliber aorta without aneurysm, dissection, vasculitis or significant stenosis. Celiac: Patent without evidence of aneurysm, dissection, vasculitis or significant stenosis. SMA: Patent without evidence of aneurysm, dissection, vasculitis or significant stenosis. Renals: Both renal arteries are patent without evidence of aneurysm, dissection, vasculitis, fibromuscular dysplasia or significant stenosis. IMA: Patent without evidence of aneurysm, dissection, vasculitis or significant stenosis. Inflow: Patent without evidence of aneurysm, dissection, vasculitis or significant stenosis. Veins: No obvious venous abnormality within the limitations of this arterial phase study. Review of the MIP images confirms the above findings. NON-VASCULAR Hepatobiliary: Hepatic steatosis. No suspicious  hepatic lesion. Gallbladder is distended without pericholecystic fluid or wall thickening. No biliary ductal dilation. Pancreas: Unremarkable. No pancreatic ductal dilatation or surrounding inflammatory changes. Spleen: Normal in size without focal abnormality. Adrenals/Urinary Tract: Adrenal glands are unremarkable. Scarring in the upper pole of the left kidney, otherwise the kidneys are normal, without renal calculi, focal lesion, or hydronephrosis. Bladder is unremarkable. Stomach/Bowel: Stomach is within normal limits. Appendix is not definitely visualized however there is no pericecal inflammation. Colonic diverticulosis without findings of acute diverticulitis. No evidence of bowel wall thickening, distention, or inflammatory changes. Lymphatic: No gastrohepatic or hepatoduodenal ligament lymphadenopathy. No retroperitoneal or mesenteric lymphadenopathy. No pelvic sidewall lymphadenopathy. No groin lymphadenopathy. Reproductive: Status post hysterectomy. No adnexal masses. Other: No abdominopelvic ascites. Musculoskeletal: Multilevel degenerative changes spine. No acute osseous abnormality. Review of the MIP images confirms the above findings. IMPRESSION: 1. No evidence of thoracic or abdominal aortic aneurysm or dissection. 2. Distended gallbladder without pericholecystic fluid or wall thickening. If there is concern for acute cholecystitis, a right upper quadrant ultrasound could be performed for further evaluation. 3. Hepatic steatosis. 4. Colonic diverticulosis without findings of acute diverticulitis. Electronically Signed   By: Dahlia Bailiff MD   On: 11/15/2020 19:27   US Abdomen Limited RUQ (LIVER/GB)  Result Date: 11/15/2020 CLINICAL DATA:  Right upper quadrant pain radiating to back x2 weeks. EXAM: ULTRASOUND ABDOMEN LIMITED RIGHT UPPER QUADRANT COMPARISON:  Same day CT chest abdomen and pelvis FINDINGS: Gallbladder: No gallstones or wall thickening visualized. No sonographic Murphy sign noted by  sonographer. Common bile duct: Diameter: 2 mm Liver: No focal lesion identified. Within normal limits in parenchymal echogenicity. Portal vein is patent on color Doppler imaging with normal direction of blood flow towards the liver. Other: None. IMPRESSION: Normal right upper quadrant ultrasound. Electronically Signed   By: Dahlia Bailiff MD   On: 11/15/2020 20:33    Procedures Procedures   Medications Ordered in ED Medications  fentaNYL (SUBLIMAZE) injection 50 mcg (50 mcg Intravenous Given 11/15/20 1639)  ondansetron (ZOFRAN) injection 4  mg (4 mg Intravenous Given 11/15/20 1639)  sodium chloride 0.9 % bolus 500 mL (0 mLs Intravenous Stopped 11/15/20 1747)  fentaNYL (SUBLIMAZE) injection 50 mcg (50 mcg Intravenous Given 11/15/20 1816)  iohexol (OMNIPAQUE) 350 MG/ML injection 100 mL (100 mLs Intravenous Contrast Given 11/15/20 1851)  fentaNYL (SUBLIMAZE) injection 50 mcg (50 mcg Intravenous Given 11/15/20 1953)  ondansetron (ZOFRAN-ODT) disintegrating tablet 8 mg (8 mg Oral Given 11/15/20 2033)   ED Course  I have reviewed the triage vital signs and the nursing notes.  Pertinent labs & imaging results that were available during my care of the patient were reviewed by me and considered in my medical decision making (see chart for details).  70 year old presents for evaluation of right lower chest pain, right upper abdomen pain.  Will radiate into flank, right scapula.  Pain x2 weeks.  Worse with movement, deep breathing.  Not associated with food intake.  Neurovascularly intact.  No history of AAA or dissection.  Does not appear grossly fluid overloaded, no unilateral leg swelling, redness or warmth.  She is without tachycardia, tachypnea or hypoxia.  Will obtain labs, imaging, pain management and reassess  Labs and imaging personally reviewed and interpreted:  CBC without leukocytosis Trop <2..<2 Ddimer 0.34 CMP sodium 126, baseline 130, given IV fluids, no additional electrolyte, renal or liver  abnormality Lipase 29 UA negative for infection EKG no significant change, no STEMI DG chest without cardiomegaly, pulm edema, pneumothorax, infiltrate   Patient reassessed. Pain mildly improved however still has moderate pain. Will give additional medication.  Discussed patient with attending.  Recommends CTA chest abdomen and pelvis.  CTA chest abdomen pelvis shows mildly distended gallbladder, recommend ultrasound.  Patient had an episode of emesis during ultrasound.  ODT Zofran given.  Not tolerating p.o. intake.  Pain improved.  Ultrasound right upper quadrant does not show any evidence of cholecystitis, cholelithiasis.  Patient symptoms seem reproducible on exam.  No overlying skin changes to suggest shingles, cellulitis or abscess.  Will DC home with pain management, anti-inflammatories, lidocaine patches and her follow-up with her PCP.  She has no midline spinal tenderness to suggest spinal etiology of pain.  No tachycardia, tachypnea or hypoxia.  No risk factors for PE.  She is Wells criteria low risk  Patient does not meet the SIRS or Sepsis criteria.  On repeat exam patient does not have a surgical abdomin and there are no peritoneal signs.  No indication of appendicitis, bowel obstruction, bowel perforation, cholecystitis, diverticulitis.  Chest pain is not likely of cardiac or pulmonary etiology d/t presentation,VSS, no tracheal deviation, no JVD or new murmur, RRR, breath sounds equal bilaterally, EKG without acute abnormalities, negative troponin, and negative CXR. Pt has been advised to return to the ED if CP becomes exertional, associated with diaphoresis or nausea, radiates to left jaw/arm, worsens or becomes concerning in any way.   The patient has been appropriately medically screened and/or stabilized in the ED. I have low suspicion for any other emergent medical condition which would require further screening, evaluation or treatment in the ED or require inpatient  management.  Patient is hemodynamically stable and in no acute distress.  Patient able to ambulate in department prior to ED.  Evaluation does not show acute pathology that would require ongoing or additional emergent interventions while in the emergency department or further inpatient treatment.  I have discussed the diagnosis with the patient and answered all questions.  Pain is been managed while in the emergency department and  patient has no further complaints prior to discharge.  Patient is comfortable with plan discussed in room and is stable for discharge at this time.  I have discussed strict return precautions for returning to the emergency department.  Patient was encouraged to follow-up with PCP/specialist refer to at discharge.  Patient seen and evaluated by attending, Dr. Rex Kras who agrees with above treatment, plan and disposition     MDM Rules/Calculators/A&P                           Final Clinical Impression(s) / ED Diagnoses Final diagnoses:  RUQ abdominal pain  Right-sided chest wall pain    Rx / DC Orders ED Discharge Orders         Ordered    oxyCODONE (ROXICODONE) 5 MG immediate release tablet  Every 6 hours PRN        11/15/20 2107    ondansetron (ZOFRAN ODT) 4 MG disintegrating tablet  Every 8 hours PRN        11/15/20 2107    lidocaine (LIDODERM) 5 %  Every 24 hours        11/15/20 2107    naproxen (NAPROSYN) 500 MG tablet  2 times daily        11/15/20 2107           Tesha Archambeau A, PA-C 11/15/20 2112    Little, Wenda Overland, MD 11/19/20 [phone]

## 2020-11-15 NOTE — Discharge Instructions (Addendum)
It was our pleasure taking care of you here in the emergency department  Your work-up did not show any evidence of bony abnormality or any organ issues your chest or abdomen.  This is likely a musculoskeletal pain.  I have written you for a short course of pain medicine.  Take as prescribed.  Only take as needed.  This medication may make you sleepy.  Follow-up with your primary care provider in the next 2 days for reevaluation

## 2022-07-08 IMAGING — DX DG CHEST 1V PORT
1 series · 1 of 1 positions shown · non-contrast
Comparison: Chest radiograph dated 01/03/2020

CLINICAL DATA: Chest pain for 1 week

EXAM:
PORTABLE CHEST 1 VIEW

[chest ap]
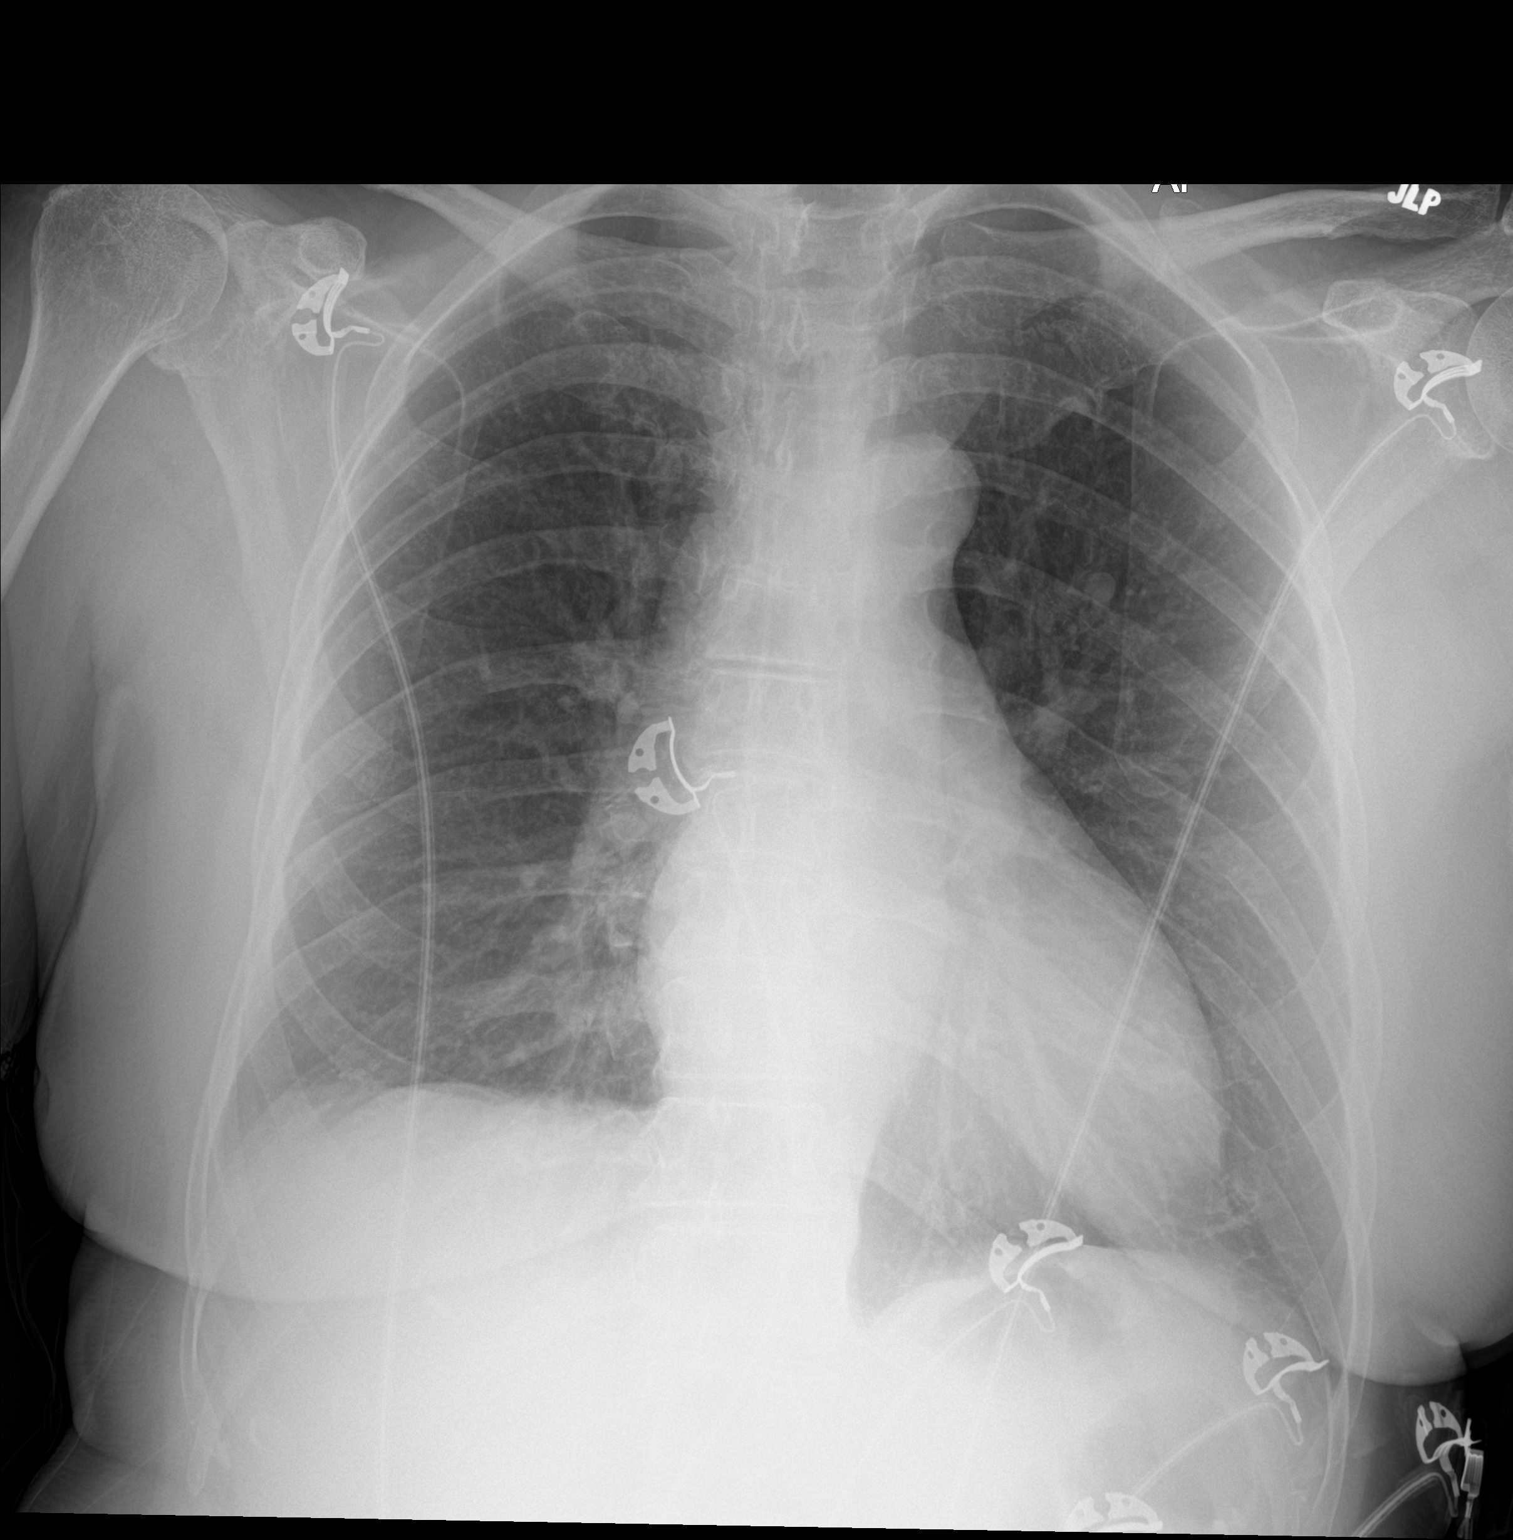

[1 of 1 positions shown; findings below may reference images not displayed]

FINDINGS: The heart size and mediastinal contours are within normal limits.
Both lungs are clear. The visualized skeletal structures are
unremarkable.
IMPRESSION: No active disease.
